# Patient Record
Sex: Female | Born: 1965 | Race: Black or African American | Hispanic: No | Marital: Single | Smoking: Never smoker
Health system: Southern US, Community
[De-identification: ages and names within clinical notes are randomized; demographics above are authoritative.]

## PROBLEM LIST (undated history)

## (undated) DIAGNOSIS — E785 Hyperlipidemia, unspecified: Secondary | ICD-10-CM

## (undated) DIAGNOSIS — E119 Type 2 diabetes mellitus without complications: Secondary | ICD-10-CM

## (undated) DIAGNOSIS — Z87442 Personal history of urinary calculi: Secondary | ICD-10-CM

## (undated) DIAGNOSIS — M1990 Unspecified osteoarthritis, unspecified site: Secondary | ICD-10-CM

## (undated) DIAGNOSIS — F419 Anxiety disorder, unspecified: Secondary | ICD-10-CM

## (undated) DIAGNOSIS — K219 Gastro-esophageal reflux disease without esophagitis: Secondary | ICD-10-CM

## (undated) DIAGNOSIS — R519 Headache, unspecified: Secondary | ICD-10-CM

## (undated) HISTORY — PX: CATARACT EXTRACTION W/ INTRAOCULAR LENS IMPLANT: SHX1309

## (undated) HISTORY — PX: KIDNEY STONE SURGERY: SHX686

## (undated) HISTORY — PX: COLONOSCOPY: SHX174

---

## 2003-03-05 ENCOUNTER — Ambulatory Visit (HOSPITAL_COMMUNITY): Admission: RE | Admit: 2003-03-05 | Discharge: 2003-03-05 | Payer: Self-pay | Admitting: *Deleted

## 2003-04-19 ENCOUNTER — Inpatient Hospital Stay (HOSPITAL_COMMUNITY): Admission: AD | Admit: 2003-04-19 | Discharge: 2003-04-19 | Payer: Self-pay | Admitting: *Deleted

## 2003-05-07 ENCOUNTER — Encounter: Admission: RE | Admit: 2003-05-07 | Discharge: 2003-05-07 | Payer: Self-pay | Admitting: *Deleted

## 2003-05-13 ENCOUNTER — Encounter: Admission: RE | Admit: 2003-05-13 | Discharge: 2003-08-11 | Payer: Self-pay | Admitting: *Deleted

## 2003-05-14 ENCOUNTER — Encounter: Admission: RE | Admit: 2003-05-14 | Discharge: 2003-05-14 | Payer: Self-pay | Admitting: *Deleted

## 2003-05-23 ENCOUNTER — Inpatient Hospital Stay (HOSPITAL_COMMUNITY): Admission: AD | Admit: 2003-05-23 | Discharge: 2003-05-23 | Payer: Self-pay | Admitting: Obstetrics & Gynecology

## 2003-05-28 ENCOUNTER — Encounter: Admission: RE | Admit: 2003-05-28 | Discharge: 2003-05-28 | Payer: Self-pay | Admitting: *Deleted

## 2003-05-28 ENCOUNTER — Ambulatory Visit (HOSPITAL_COMMUNITY): Admission: RE | Admit: 2003-05-28 | Discharge: 2003-05-28 | Payer: Self-pay | Admitting: *Deleted

## 2003-06-11 ENCOUNTER — Encounter: Admission: RE | Admit: 2003-06-11 | Discharge: 2003-06-11 | Payer: Self-pay | Admitting: *Deleted

## 2003-06-18 ENCOUNTER — Encounter: Admission: RE | Admit: 2003-06-18 | Discharge: 2003-06-18 | Payer: Self-pay | Admitting: *Deleted

## 2003-06-25 ENCOUNTER — Encounter: Admission: RE | Admit: 2003-06-25 | Discharge: 2003-06-25 | Payer: Self-pay | Admitting: *Deleted

## 2003-07-02 ENCOUNTER — Ambulatory Visit (HOSPITAL_COMMUNITY): Admission: RE | Admit: 2003-07-02 | Discharge: 2003-07-02 | Payer: Self-pay | Admitting: *Deleted

## 2003-07-02 ENCOUNTER — Encounter: Admission: RE | Admit: 2003-07-02 | Discharge: 2003-07-02 | Payer: Self-pay | Admitting: *Deleted

## 2003-07-04 ENCOUNTER — Encounter: Admission: RE | Admit: 2003-07-04 | Discharge: 2003-07-04 | Payer: Self-pay | Admitting: *Deleted

## 2003-07-09 ENCOUNTER — Encounter: Admission: RE | Admit: 2003-07-09 | Discharge: 2003-07-09 | Payer: Self-pay | Admitting: *Deleted

## 2003-07-11 ENCOUNTER — Encounter: Admission: RE | Admit: 2003-07-11 | Discharge: 2003-07-11 | Payer: Self-pay | Admitting: *Deleted

## 2003-07-11 ENCOUNTER — Inpatient Hospital Stay (HOSPITAL_COMMUNITY): Admission: AD | Admit: 2003-07-11 | Discharge: 2003-07-14 | Payer: Self-pay | Admitting: Obstetrics and Gynecology

## 2003-07-12 ENCOUNTER — Encounter (INDEPENDENT_AMBULATORY_CARE_PROVIDER_SITE_OTHER): Payer: Self-pay | Admitting: Specialist

## 2003-07-18 ENCOUNTER — Encounter: Admission: RE | Admit: 2003-07-18 | Discharge: 2003-07-18 | Payer: Self-pay | Admitting: Family Medicine

## 2009-12-28 ENCOUNTER — Encounter: Admission: RE | Admit: 2009-12-28 | Discharge: 2009-12-28 | Payer: Self-pay | Admitting: Internal Medicine

## 2010-01-29 ENCOUNTER — Encounter: Admission: RE | Admit: 2010-01-29 | Discharge: 2010-01-29 | Payer: Self-pay | Admitting: Internal Medicine

## 2010-04-16 ENCOUNTER — Emergency Department (HOSPITAL_COMMUNITY): Admission: EM | Admit: 2010-04-16 | Discharge: 2010-04-16 | Payer: Self-pay | Admitting: Emergency Medicine

## 2010-04-22 ENCOUNTER — Emergency Department (HOSPITAL_COMMUNITY): Admission: EM | Admit: 2010-04-22 | Discharge: 2010-04-22 | Payer: Self-pay | Admitting: Emergency Medicine

## 2010-12-08 ENCOUNTER — Emergency Department (HOSPITAL_COMMUNITY)
Admission: EM | Admit: 2010-12-08 | Discharge: 2010-12-08 | Payer: Self-pay | Source: Home / Self Care | Admitting: Emergency Medicine

## 2010-12-08 LAB — RAPID STREP SCREEN (MED CTR MEBANE ONLY): Streptococcus, Group A Screen (Direct): NEGATIVE

## 2011-02-22 LAB — URINALYSIS, ROUTINE W REFLEX MICROSCOPIC
Bilirubin Urine: NEGATIVE
Glucose, UA: NEGATIVE mg/dL
Ketones, ur: NEGATIVE mg/dL
Nitrite: NEGATIVE
Protein, ur: NEGATIVE mg/dL
Specific Gravity, Urine: 1.012 (ref 1.005–1.030)
Urobilinogen, UA: 1 mg/dL (ref 0.0–1.0)
pH: 6.5 (ref 5.0–8.0)

## 2011-02-22 LAB — URINE MICROSCOPIC-ADD ON

## 2011-02-22 LAB — POCT I-STAT, CHEM 8
BUN: 14 mg/dL (ref 6–23)
Calcium, Ion: 1.12 mmol/L (ref 1.12–1.32)
Chloride: 106 mEq/L (ref 96–112)
Creatinine, Ser: 0.7 mg/dL (ref 0.4–1.2)
Glucose, Bld: 88 mg/dL (ref 70–99)
HCT: 45 % (ref 36.0–46.0)
Hemoglobin: 15.3 g/dL — ABNORMAL HIGH (ref 12.0–15.0)
Potassium: 3.9 mEq/L (ref 3.5–5.1)
Sodium: 141 mEq/L (ref 135–145)
TCO2: 26 mmol/L (ref 0–100)

## 2011-02-22 LAB — POCT PREGNANCY, URINE: Preg Test, Ur: NEGATIVE

## 2011-04-22 NOTE — Discharge Summary (Signed)
   NAME:  Latoya Quinn, Latoya Quinn                          ACCOUNT NO.:  [id number]   MEDICAL RECORD NO.:  [id number]                   PATIENT TYPE:  INP   LOCATION:  9106                                 FACILITY:  [facility]   PHYSICIAN:  Phil D. Okey Dupre, M.D.                  DATE OF BIRTH:  [birth year]   DATE OF ADMISSION:  07/11/2003  DATE OF DISCHARGE:  07/14/2003                                 DISCHARGE SUMMARY   PRIMARY CARE PHYSICIAN:  [facility].   FINAL DIAGNOSES:  1. The patient is a 45 year old G3, P0-1-1-1 now G3, P1-1-1-2 who presented     at 38-4/7 weeks who went on to have a normal spontaneous vaginal delivery     of a live baby girl.  2. Bilateral tubal ligation performed postpartum day #1.   PRINCIPAL PROCEDURES:  1. Normal spontaneous vaginal delivery.  2. Bilateral tubal ligation performed by Dr. Shawnie Pons.   ADMISSION HISTORY AND PHYSICAL:  Please see chart.   LABORATORY DATA:  Discharge H&H please see chart.   HOSPITAL COURSE:  Ms. [name], a 45 year old G3, P0-1-1-1, presented at  38-4/7 weeks in labor.  She continued on to have a normal spontaneous  vaginal delivery and is now G3, P1-1-1-2.  Ms. [name] delivery was  unremarkable except for a knot in the placenta cord.  The placenta was  otherwise normal and postpartum day #1 Ms. [name] had a bilateral tubal  ligation performed which went well with no complications.  Ms. [name]  postpartum hospital course was unremarkable.  She desires to breast-feed the  baby with supplemental bottle feeds.  The baby was a viable girl.  The baby  had Apgar scores of 8 and 9 and there were no lacerations at the time of  delivery.  At the time of discharge the patient was in good condition.   INSTRUCTION TO PATIENT AND FAMILY:  The patient was instructed on care for  her incisions from the tubal ligation, she will remain on a normal diet and  is to follow up at [facility] in 6 weeks.   DISCHARGE MEDICATIONS:  1.  Percocet 5/325 one tablet p.o. q.4h. as needed for pain.  2. Ibuprofen 600 mg one tablet q.6h. as needed for pain.  3. Prenatal vitamins to be continued while breast-feeding.  4. Ferrous sulfate 325 mg one tab p.o. once daily.    ADVANCED DIRECTIVES:  None.   DO NOT RESUSCITATE STATUS:  Full Code.     Penni Bombard, MD                          Phil D. Okey Dupre, M.D.    SJ/MEDQ  D:  07/13/2003  T:  07/13/2003  Job:  [id number]   cc:   [facility]

## 2011-04-22 NOTE — Op Note (Signed)
NAME:  Latoya Quinn, Latoya Quinn                          ACCOUNT NO.:  [id number]   MEDICAL RECORD NO.:  [id number]                   PATIENT TYPE:  INP   LOCATION:  9106                                 [facility]:  [facility]   PHYSICIAN:  Tanya S. Shawnie Pons, M.D.                DATE OF BIRTH:  17-Mar-1966   DATE OF PROCEDURE:  07/13/2003  DATE OF DISCHARGE:                                 OPERATIVE REPORT   PREOPERATIVE DIAGNOSIS:  Multiparity and undesired fertility.   POSTOPERATIVE DIAGNOSIS:  Multiparity and undesired fertility.   PROCEDURE:  A postpartum bilateral tubal ligation with Filshie clips.   SURGEON:  Shelbie Proctor. Shawnie Pons, M.D.   ANESTHESIA:  Epidural, Germaine Pomfret, M.D.   FINDINGS:  Numerous omental adhesions to the anterior abdominal wall. Normal  appearing left ovary.   ESTIMATED BLOOD LOSS:  Less than 25 cc.   COMPLICATIONS:  None.   SPECIMENS:  None.   INDICATIONS FOR PROCEDURE:  The patient is a 45 year old gravida 2, para 1,  1-1-0-2 who is postpartum day #1 after  a spontaneous vaginal delivery who  desires permanent sterility. All risks and benefits of the procedure were  discussed with  prior to proceeding including  permanency of this procedure,  the risks of failure at 1 in 200 and increased risk of ectopic should  failure occur. The patient verbalized understanding  of these risks and  agreed to proceed with the procedure.   DESCRIPTION OF PROCEDURE:  The patient was taken to the operating room where  her epidural was dosed. She was then placed in the supine position on the  operating table. When anesthesia was found to be adequate she was prepped  and draped in the usual sterile fashion. Then 0.25% Marcaine was used to  inject the infraumbilical area, approximately 5 mL.   The 2 edges of  the incision were then grasped with Allis clamps and the  knife was used to make an incision infraumbilically of about 2 cm. This  incision was carried down to the underlying  fascia and the peritoneal cavity  was entered sharply. Upon admission to the peritoneal cavity, only the  omentum was visible as well as a couple of loops of large bowel. The uterus  could not be seen.   The patient was placed in deep Trendelenburg and still the uterus could not  be seen. A sponge stick was used to try to relocate the omentum  which could  not be  moved out of the way. Upon feeling down the anterior abdominal wall,  it was felt that the omentum  was strongly adherent to the anterior  abdominal wall. There was, however, an area where the surgeons fingers could  be placed down to the underlying uterus and the adnexa felt.   A Tanja Port was then followed  down the surgeon's fingers to grasp something  in the adnexa. In fact  the ovary was brought through the incision but the  tube was right-sided. It was quickly grasped with the Babcock clamp. The  fimbriated edge was visible at the time of 1st Babcock placement. This was  followed  back to approximately 2 cm from the uterus and a Filshie clip  placed about this tube.   On the patient's right side just by moving the omentum  over to the left,  the adnexa was visible and the tube grasped with  the Babcock clamp and  followed  to its fimbriated end. Approximately 2 cm from the cornu a Filshie  clip was placed about this segment of tube. The 1st Filshie clip did not  make it all the way around  1 side of the tube, so a 2nd one was placed  beside that and felt to be in the correct position.   The tube was allowed to return to the abdominal cavity. The fascia was then  closed with #1 Vicryl suture in a running fashion and the skin was closed  with 4-0 Vicryl in a subcuticular fashion. All instrument, needle  and lap  counts were correct x2. The patient was awakened and taken to the recovery  room in stable condition.                                               Shelbie Proctor. Shawnie Pons, M.D.    TSP/MEDQ  D:  07/13/2003  T:   07/13/2003  Job:  [id number]

## 2012-07-03 ENCOUNTER — Encounter (HOSPITAL_COMMUNITY): Payer: Self-pay | Admitting: Emergency Medicine

## 2012-07-03 ENCOUNTER — Emergency Department (HOSPITAL_COMMUNITY)
Admission: EM | Admit: 2012-07-03 | Discharge: 2012-07-03 | Disposition: A | Discharge status: Home Health | Payer: BC Managed Care – PPO | Attending: Emergency Medicine | Admitting: Emergency Medicine

## 2012-07-03 DIAGNOSIS — L259 Unspecified contact dermatitis, unspecified cause: Secondary | ICD-10-CM | POA: Insufficient documentation

## 2012-07-03 MED ORDER — FAMOTIDINE 20 MG PO TABS
20.0000 mg | ORAL_TABLET | Freq: Once | ORAL | Status: AC
  Administered 2012-07-03: 20 mg via ORAL
  Filled 2012-07-03: qty 1

## 2012-07-03 MED ORDER — PREDNISONE 20 MG PO TABS: 40.0000 mg | ORAL_TABLET | Freq: Every day | ORAL | Status: AC

## 2012-07-03 MED ORDER — PREDNISONE 20 MG PO TABS
60.0000 mg | ORAL_TABLET | Freq: Once | ORAL | Status: AC
  Administered 2012-07-03: 60 mg via ORAL
  Filled 2012-07-03: qty 3

## 2012-07-03 MED ORDER — FAMOTIDINE 20 MG PO TABS: 20.0000 mg | ORAL_TABLET | Freq: Two times a day (BID) | ORAL | Status: DC

## 2012-07-03 NOTE — ED Provider Notes (Signed)
History     CSN: [id number]  Arrival date & time 07/03/12  1956   First MD Initiated Contact with Patient 07/03/12 2111      Chief Complaint  Patient presents with  . Rash    (Consider location/radiation/quality/duration/timing/severity/associated sxs/prior treatment) HPI Comments: After going to the public pool.  She noticed some itchy lesions on her right forearm.  This has progressively gotten worse over the past week, to include both forearms.  Neck both anterior and posterior and circumflex oral just at the edge of the vermilion border.  Does not intrude into oral musoca  Patient is a 46 y.o. female presenting with rash. The history is provided by the patient.  Rash  This is a new problem. The current episode started more than 2 days ago. The problem has been gradually worsening. The problem is associated with an unknown factor. There has been no fever. The rash is present on the neck, right arm and left arm. The pain is at a severity of 0/10. The patient is experiencing no pain. Associated symptoms include itching. Pertinent negatives include no pain and no weeping. She has tried anti-itch cream for the symptoms. The treatment provided no relief. Risk factors include new environmental exposures.    History reviewed. No pertinent past medical history.  History reviewed. No pertinent past surgical history.  No family history on file.  History  Substance Use Topics  . Smoking status: Never Smoker   . Smokeless tobacco: Not on file  . Alcohol Use: No    OB History    Grav Para Term Preterm Abortions TAB SAB Ect Mult Living                  Review of Systems  Constitutional: Negative for fever.  Respiratory: Negative for shortness of breath.   Musculoskeletal: Negative for joint swelling.  Skin: Positive for itching and rash.  Neurological: Negative for dizziness.    Allergies  Penicillins and Sulfa antibiotics  Home Medications   Current Outpatient Rx  Name  Route Sig Dispense Refill  . FAMOTIDINE 20 MG PO TABS Oral Take 1 tablet (20 mg total) by mouth 2 (two) times daily. 20 tablet 0  . PREDNISONE 20 MG PO TABS Oral Take 2 tablets (40 mg total) by mouth daily. 10 tablet 0    BP 148/97  Pulse 68  Temp 98.3 F (36.8 C) (Oral)  Resp 18  SpO2 99%  Physical Exam  Constitutional: She appears well-developed and well-nourished.  HENT:  Head: Normocephalic.       Rash along the vermilion border, upper and lower does not appear to be herpetic  Eyes: Pupils are equal, round, and reactive to light.  Neck: Normal range of motion.  Cardiovascular: Normal rate.   Musculoskeletal: Normal range of motion. She exhibits no tenderness.  Neurological: She is alert.  Skin: Skin is warm. Rash noted. No erythema.    ED Course  Procedures (including critical care time)  Labs Reviewed - No data to display No results found.   1. Contact dermatitis       MDM   The contact dermatitis.  That is getting worse despite the use of over-the-counter cortisone cream and by mouth Zyrtec.  Will change to a steroid taper, as well as Pepcid        Arman Filter, NP 07/03/12 2128  Arman Filter, NP 07/03/12 2128

## 2012-07-03 NOTE — ED Notes (Signed)
PT. REPORTS ITCHY RASHES AT ARMS , NECK AND LIPS ONSET LAST WEEK WORSE TODAY UNRELIEVED BY OTC CORTIZONE CREAM , RESPIRATIONS UNLABORED.

## 2012-07-04 NOTE — ED Provider Notes (Signed)
Medical screening examination/treatment/procedure(s) were performed by non-physician practitioner and as supervising physician I was immediately available for consultation/collaboration.  Derwood Kaplan, MD 07/04/12 [phone]

## 2016-06-30 ENCOUNTER — Ambulatory Visit: Admit: 2016-06-30 | Payer: BC Managed Care – PPO | Admitting: Urology

## 2016-06-30 SURGERY — LITHOTRIPSY, ESWL
Anesthesia: LOCAL | Laterality: Left

## 2018-06-27 ENCOUNTER — Emergency Department (HOSPITAL_COMMUNITY)
Admission: EM | Admit: 2018-06-27 | Discharge: 2018-06-27 | Disposition: A | Discharge status: Home / Self Care | Payer: BC Managed Care – PPO | Attending: Emergency Medicine | Admitting: Emergency Medicine

## 2018-06-27 ENCOUNTER — Other Ambulatory Visit: Payer: Self-pay

## 2018-06-27 ENCOUNTER — Emergency Department (HOSPITAL_COMMUNITY): Payer: BC Managed Care – PPO

## 2018-06-27 ENCOUNTER — Encounter (HOSPITAL_COMMUNITY): Payer: Self-pay

## 2018-06-27 DIAGNOSIS — Y9301 Activity, walking, marching and hiking: Secondary | ICD-10-CM | POA: Insufficient documentation

## 2018-06-27 DIAGNOSIS — W101XXA Fall (on)(from) sidewalk curb, initial encounter: Secondary | ICD-10-CM | POA: Insufficient documentation

## 2018-06-27 DIAGNOSIS — Z79899 Other long term (current) drug therapy: Secondary | ICD-10-CM | POA: Diagnosis not present

## 2018-06-27 DIAGNOSIS — S93401A Sprain of unspecified ligament of right ankle, initial encounter: Secondary | ICD-10-CM | POA: Diagnosis not present

## 2018-06-27 DIAGNOSIS — S99911A Unspecified injury of right ankle, initial encounter: Secondary | ICD-10-CM | POA: Diagnosis present

## 2018-06-27 DIAGNOSIS — Y929 Unspecified place or not applicable: Secondary | ICD-10-CM | POA: Insufficient documentation

## 2018-06-27 DIAGNOSIS — Y999 Unspecified external cause status: Secondary | ICD-10-CM | POA: Insufficient documentation

## 2018-06-27 MED ORDER — IBUPROFEN 800 MG PO TABS: 800.0000 mg | ORAL_TABLET | Freq: Three times a day (TID) | ORAL | 0 refills | Status: DC | PRN

## 2018-06-27 MED ORDER — TRAMADOL HCL 50 MG PO TABS: 50.0000 mg | ORAL_TABLET | Freq: Four times a day (QID) | ORAL | 0 refills | Status: DC | PRN

## 2018-06-27 NOTE — ED Triage Notes (Signed)
EMS reports Pt walking into work, fell from curb rolled ankle, c/o pain and swelling to right ankle. Denies dizziness, LOC or other pain.  BP 160/100 HR 79 Resp 18 Sp02 100 RA

## 2018-06-27 NOTE — Discharge Instructions (Signed)
Follow-up with your family doctor next week or you can follow-up with the orthopedic surgeon Dr. Everardo PacificVarkey.  Keep your leg elevated and use your crutches for the next week to help with ambulation

## 2018-06-27 NOTE — ED Provider Notes (Signed)
[facility] Note   CSN: [id number] Arrival date & time: 06/27/18  0813     History   Chief Complaint Chief Complaint  Patient presents with  . Fall  . Ankle Pain  . Joint Swelling    HPI Ellin GoodieLenora S Olson is a 52 y.o. female.  Patient turned her right ankle today on a curve.  She complains of lateral right ankle pain  The history is provided by the patient. No language interpreter was used.  Fall  This is a new problem. The current episode started 1 to 2 hours ago. The problem occurs rarely. The problem has been resolved. Pertinent negatives include no chest pain, no abdominal pain and no headaches. Exacerbated by: Palpation and ambulation. Nothing relieves the symptoms. She has tried nothing for the symptoms. The treatment provided no relief.    History reviewed. No pertinent past medical history.  There are no active problems to display for this patient.   History reviewed. No pertinent surgical history.   OB History   None      Home Medications    Prior to Admission medications   Medication Sig Start Date End Date Taking? Authorizing Provider  hydroxychloroquine (PLAQUENIL) 200 MG tablet Take 400 mg by mouth daily. 06/04/18  Yes [provider]  PARoxetine (PAXIL) 10 MG tablet Take 10 mg by mouth every morning. 05/23/18  Yes [provider]  famotidine (PEPCID) 20 MG tablet Take 1 tablet (20 mg total) by mouth 2 (two) times daily. 07/03/12 07/03/13  Earley FavorSchulz, Gail, NP  ibuprofen (ADVIL,MOTRIN) 800 MG tablet Take 1 tablet (800 mg total) by mouth every 8 (eight) hours as needed for moderate pain. 06/27/18   Bethann BerkshireZammit, Jujhar Everett, MD  traMADol (ULTRAM) 50 MG tablet Take 1 tablet (50 mg total) by mouth every 6 (six) hours as needed. 06/27/18   Bethann BerkshireZammit, Ricardo Kayes, MD    Family History History reviewed. No pertinent family history.  Social History Social History   Tobacco Use  . Smoking status: Never Smoker  . Smokeless  tobacco: Never Used  Substance Use Topics  . Alcohol use: No  . Drug use: No     Allergies   Penicillins and Sulfa antibiotics   Review of Systems Review of Systems  Constitutional: Negative for appetite change and fatigue.  HENT: Negative for congestion, ear discharge and sinus pressure.   Eyes: Negative for discharge.  Respiratory: Negative for cough.   Cardiovascular: Negative for chest pain.  Gastrointestinal: Negative for abdominal pain and diarrhea.  Genitourinary: Negative for frequency and hematuria.  Musculoskeletal: Negative for back pain.       Swelling lateral right ankle neurovascular exam intact stable ankle.  Also tenderness  Skin: Negative for rash.  Neurological: Negative for seizures and headaches.  Psychiatric/Behavioral: Negative for hallucinations.     Physical Exam Updated Vital Signs BP (!) 145/89   Pulse 65   Temp 97.8 F (36.6 C) (Oral)   Resp 16   Ht 5\' 6"  (1.676 m)   Wt 82.1 kg (181 lb)   SpO2 100%   BMI 29.21 kg/m   Physical Exam  Constitutional: She is oriented to person, place, and time. She appears well-developed.  HENT:  Head: Normocephalic.  Eyes: Conjunctivae are normal.  Neck: No tracheal deviation present.  Cardiovascular:  No murmur heard. Musculoskeletal: Normal range of motion.  Tender swollen right lateral ankle neurovascularly normal  Neurological: She is oriented to person, place, and time.  Skin: Skin is warm.  Psychiatric: She has a normal mood and affect.     ED Treatments / Results  Labs (all labs ordered are listed, but only abnormal results are displayed) Labs Reviewed - No data to display  EKG None  Radiology Dg Ankle Complete Right  Result Date: 06/27/2018 CLINICAL DATA:  Fall with right ankle pain. EXAM: RIGHT ANKLE - COMPLETE 3+ VIEW COMPARISON:  None. FINDINGS: Right ankle is located. There is soft tissue swelling particularly along the lateral aspect. There are multiple densities just distal to  the lateral malleolus and findings raise concern for an acute avulsion injury in this area. However, this avulsion injury is age indeterminate due to the multiple loose bodies in this area. Multiple loose bodies just distal to the medial malleolus. Prominent spur along the plantar aspect of the calcaneus. IMPRESSION: Soft tissue swelling in the right ankle, particularly along the lateral malleolus. Evidence for an avulsion injury along the lateral malleolus which is likely acute. Chronic changes along the medial malleolus. Calcaneal spur. Electronically Signed   By: Richarda Overlie M.D.   On: 06/27/2018 09:03    Procedures Procedures (including critical care time)  Medications Ordered in ED Medications - No data to display   Initial Impression / Assessment and Plan / ED Course  I have reviewed the triage vital signs and the nursing notes.  Pertinent labs & imaging results that were available during my care of the patient were reviewed by me and considered in my medical decision making (see chart for details). Sprain right ankle with possible mild avulsion fracture.  She is given a cam walker and crutches and Motrin and Ultram and will follow up with either her family doctor or orthopedics     Final Clinical Impressions(s) / ED Diagnoses   Final diagnoses:  Sprain of right ankle, unspecified ligament, initial encounter    ED Discharge Orders        Ordered    ibuprofen (ADVIL,MOTRIN) 800 MG tablet  Every 8 hours PRN     06/27/18 1001    traMADol (ULTRAM) 50 MG tablet  Every 6 hours PRN     06/27/18 1001       Bethann Berkshire, MD 06/27/18 1006

## 2018-06-27 NOTE — ED Notes (Signed)
Bed: WA10 Expected date: 06/27/18 Expected time: 8:12 AM Means of arrival: Ambulance Comments: EMS ankle pain

## 2020-01-24 ENCOUNTER — Ambulatory Visit: Payer: BC Managed Care – PPO | Attending: Internal Medicine

## 2020-01-24 DIAGNOSIS — Z23 Encounter for immunization: Secondary | ICD-10-CM | POA: Insufficient documentation

## 2020-01-24 NOTE — Progress Notes (Signed)
   Covid-19 Vaccination Clinic  Name:  RIELY OETKEN    MRN: [id number] DOB: 1966-10-12  01/24/2020  Ms. Doepke was observed post Covid-19 immunization for 15 minutes without incidence. She was provided with Vaccine Information Sheet and instruction to access the V-Safe system.   Ms. Battiste was instructed to call 911 with any severe reactions post vaccine: Marland Kitchen Difficulty breathing  . Swelling of your face and throat  . A fast heartbeat  . A bad rash all over your body  . Dizziness and weakness    Immunizations Administered    Name Date Dose VIS Date Route   Pfizer COVID-19 Vaccine 01/24/2020  3:52 PM 0.3 mL 11/15/2019 Intramuscular   Manufacturer: [employer], [employer]   Lot: [id number]   NDC: [address]-6

## 2020-02-18 ENCOUNTER — Ambulatory Visit: Payer: BC Managed Care – PPO | Attending: Internal Medicine

## 2020-02-18 DIAGNOSIS — Z23 Encounter for immunization: Secondary | ICD-10-CM

## 2020-02-18 NOTE — Progress Notes (Signed)
   Covid-19 Vaccination Clinic  Name:  Latoya Quinn    MRN: [id number] DOB: 1966-09-13  02/18/2020  Latoya Quinn was observed post Covid-19 immunization for 30 minutes based on pre-vaccination screening without incident. She was provided with Vaccine Information Sheet and instruction to access the V-Safe system.   Latoya Quinn was instructed to call 911 with any severe reactions post vaccine: Marland Kitchen Difficulty breathing  . Swelling of face and throat  . A fast heartbeat  . A bad rash all over body  . Dizziness and weakness   Immunizations Administered    Name Date Dose VIS Date Route   Pfizer COVID-19 Vaccine 02/18/2020  4:38 PM 0.3 mL 11/15/2019 Intramuscular   Manufacturer: [employer], [employer]   Lot: [id number]   NDC: [address]-6

## 2021-04-15 ENCOUNTER — Telehealth: Payer: BC Managed Care – PPO | Admitting: Physician Assistant

## 2021-04-15 DIAGNOSIS — R300 Dysuria: Secondary | ICD-10-CM

## 2021-04-15 MED ORDER — NITROFURANTOIN MONOHYD MACRO 100 MG PO CAPS: 100.0000 mg | ORAL_CAPSULE | Freq: Two times a day (BID) | ORAL | 0 refills | Status: DC

## 2021-04-15 NOTE — Progress Notes (Signed)
I have spent 5 minutes in review of e-visit questionnaire, review and updating patient chart, medical decision making and response to patient.   Fayette Hamada Cody Donita Newland, PA-C    

## 2021-04-15 NOTE — Progress Notes (Signed)

## 2021-11-04 ENCOUNTER — Emergency Department (HOSPITAL_COMMUNITY)
Admission: EM | Admit: 2021-11-04 | Discharge: 2021-11-05 | Disposition: A | Discharge status: Home / Self Care | Payer: BC Managed Care – PPO | Attending: Emergency Medicine | Admitting: Emergency Medicine

## 2021-11-04 ENCOUNTER — Other Ambulatory Visit: Payer: Self-pay

## 2021-11-04 ENCOUNTER — Emergency Department (HOSPITAL_COMMUNITY): Payer: BC Managed Care – PPO

## 2021-11-04 DIAGNOSIS — J101 Influenza due to other identified influenza virus with other respiratory manifestations: Secondary | ICD-10-CM | POA: Insufficient documentation

## 2021-11-04 DIAGNOSIS — H938X3 Other specified disorders of ear, bilateral: Secondary | ICD-10-CM | POA: Diagnosis not present

## 2021-11-04 DIAGNOSIS — R519 Headache, unspecified: Secondary | ICD-10-CM | POA: Diagnosis present

## 2021-11-04 DIAGNOSIS — Z20822 Contact with and (suspected) exposure to covid-19: Secondary | ICD-10-CM | POA: Diagnosis not present

## 2021-11-04 LAB — COMPREHENSIVE METABOLIC PANEL
ALT: 54 U/L — ABNORMAL HIGH (ref 0–44)
AST: 57 U/L — ABNORMAL HIGH (ref 15–41)
Albumin: 3.6 g/dL (ref 3.5–5.0)
Alkaline Phosphatase: 79 U/L (ref 38–126)
Anion gap: 8 (ref 5–15)
BUN: 8 mg/dL (ref 6–20)
CO2: 26 mmol/L (ref 22–32)
Calcium: 8.7 mg/dL — ABNORMAL LOW (ref 8.9–10.3)
Chloride: 101 mmol/L (ref 98–111)
Creatinine, Ser: 0.94 mg/dL (ref 0.44–1.00)
GFR, Estimated: >60 mL/min (ref >60)
Glucose, Bld: 128 mg/dL — ABNORMAL HIGH (ref 70–99)
Potassium: 4.4 mmol/L (ref 3.5–5.1)
Sodium: 135 mmol/L (ref 135–145)
Total Bilirubin: 1 mg/dL (ref 0.3–1.2)
Total Protein: 7.6 g/dL (ref 6.5–8.1)

## 2021-11-04 LAB — CBC WITH DIFFERENTIAL/PLATELET
Abs Immature Granulocytes: 0.01 10*3/uL (ref 0.00–0.07)
Basophils Absolute: 0 10*3/uL (ref 0.0–0.1)
Basophils Relative: 1 %
Eosinophils Absolute: 0 10*3/uL (ref 0.0–0.5)
Eosinophils Relative: 0 %
HCT: 47.1 % — ABNORMAL HIGH (ref 36.0–46.0)
Hemoglobin: 15.9 g/dL — ABNORMAL HIGH (ref 12.0–15.0)
Immature Granulocytes: 0 %
Lymphocytes Relative: 27 %
Lymphs Abs: 1.2 10*3/uL (ref 0.7–4.0)
MCH: 29.9 pg (ref 26.0–34.0)
MCHC: 33.8 g/dL (ref 30.0–36.0)
MCV: 88.5 fL (ref 80.0–100.0)
Monocytes Absolute: 0.5 10*3/uL (ref 0.1–1.0)
Monocytes Relative: 11 %
Neutro Abs: 2.6 10*3/uL (ref 1.7–7.7)
Neutrophils Relative %: 61 %
Platelets: 198 10*3/uL (ref 150–400)
RBC: 5.32 MIL/uL — ABNORMAL HIGH (ref 3.87–5.11)
RDW: 13.1 % (ref 11.5–15.5)
WBC: 4.3 10*3/uL (ref 4.0–10.5)
nRBC: 0 % (ref 0.0–0.2)

## 2021-11-04 LAB — RESP PANEL BY RT-PCR (FLU A&B, COVID) ARPGX2
Influenza A by PCR: POSITIVE — AB
Influenza B by PCR: NEGATIVE
SARS Coronavirus 2 by RT PCR: NEGATIVE

## 2021-11-04 MED ORDER — ACETAMINOPHEN 325 MG PO TABS
650.0000 mg | ORAL_TABLET | Freq: Once | ORAL | Status: AC
  Administered 2021-11-04: 650 mg via ORAL
  Filled 2021-11-04: qty 2

## 2021-11-04 NOTE — ED Provider Notes (Signed)
Emergency Medicine Provider Triage Evaluation Note  Latoya Quinn , a 55 y.o. female  was evaluated in triage.  Pt complains of headache, intermittent episodes of confusion.  These have been ongoing for the past 2 days.  Evaluated by PCP who requested a flu/COVID test. Friend at the bedside, reports she was "lethargic ", these last couple of days.  She has been confusing her times and dates.  Patient reports some chills, a subjective fever.  Describes the headache as nagging, despite Tylenol without any improvement.   Review of Systems  Positive: Confusion, chills, headache, generalized weakness Negative: Trauma, shortness of breath, chest pain  Physical Exam  BP (!) 132/95 (BP Location: Right Arm)   Pulse (!) 113   Temp 99.9 F (37.7 C) (Oral)   Resp 20   SpO2 97%  Gen:   Awake, no distress   Resp:  Normal effort  MSK:   Moves extremities without difficulty  Other:    Medical Decision Making  Medically screening exam initiated at 7:40 PM.  Appropriate orders placed.  Latoya Quinn was informed that the remainder of the evaluation will be completed by another provider, this initial triage assessment does not replace that evaluation, and the importance of remaining in the ED until their evaluation is complete.  Patient here for generalized weakness, nausea, vomiting, headache since Tuesday.  Per PCP who recommended CT head along with test for COVID and flu.  Patient's vitals are overall well-appearing, slight temperature at 99.9, given tylenol while in the triage.    Claude Manges, PA-C 11/04/21 1946    Mancel Bale, MD 11/04/21 [phone]

## 2021-11-04 NOTE — ED Triage Notes (Signed)
Pt states she was sent by her doctor for a flu/covid test and head CT. C/o nausea/vomiting and headache since Tuesday. Also c/o intermittent confusion. Pt A&Ox4 at this time.

## 2021-11-05 LAB — URINALYSIS, MICROSCOPIC (REFLEX)

## 2021-11-05 LAB — URINALYSIS, ROUTINE W REFLEX MICROSCOPIC
Bilirubin Urine: NEGATIVE
Glucose, UA: NEGATIVE mg/dL
Hgb urine dipstick: NEGATIVE
Ketones, ur: 15 mg/dL — AB
Nitrite: NEGATIVE
Protein, ur: NEGATIVE mg/dL
Specific Gravity, Urine: 1.025 (ref 1.005–1.030)
pH: 6 (ref 5.0–8.0)

## 2021-11-05 MED ORDER — ACETAMINOPHEN 325 MG PO TABS
650.0000 mg | ORAL_TABLET | Freq: Once | ORAL | Status: AC
  Administered 2021-11-05: 650 mg via ORAL
  Filled 2021-11-05: qty 2

## 2021-11-05 MED ORDER — SODIUM CHLORIDE 0.9 % IV BOLUS
1000.0000 mL | Freq: Once | INTRAVENOUS | Status: AC
  Administered 2021-11-05: 1000 mL via INTRAVENOUS

## 2021-11-05 MED ORDER — IBUPROFEN 400 MG PO TABS
600.0000 mg | ORAL_TABLET | Freq: Once | ORAL | Status: AC
  Administered 2021-11-05: 600 mg via ORAL
  Filled 2021-11-05: qty 1

## 2021-11-05 MED ORDER — ONDANSETRON HCL 4 MG/2ML IJ SOLN
4.0000 mg | Freq: Once | INTRAMUSCULAR | Status: AC
  Administered 2021-11-05: 4 mg via INTRAVENOUS
  Filled 2021-11-05: qty 2

## 2021-11-05 NOTE — ED Provider Notes (Signed)
[facility]PART[facility] Provider Note   CSN: [id number] Arrival date & time: 11/04/21  1725     History Chief Complaint  Patient presents with   Headache    Latoya Quinn is a 55 y.o. female.  55 year old female with history of hyperlipidemia and prediabetes presents with complaint of headache, body aches, fever, chills, cough and vomiting.  Patient reports her symptoms started on 11/02/2021.  No known sick contacts.  Vomiting has since resolved, not having any diarrhea or abdominal pain.  Reports that she has been feeling much lethargic for the past few days which prompted her to come to the emergency room.  Denies falls or injuries.  Denies history of asthma or other chronic lung disease. No other complaints or concerns.         OB History   No obstetric history on file.     No family history on file.  Social History   Tobacco Use   Smoking status: Never   Smokeless tobacco: Never  Substance Use Topics   Alcohol use: No   Drug use: No    Home Medications Prior to Admission medications   Medication Sig Start Date End Date Taking? Authorizing Provider  famotidine (PEPCID) 20 MG tablet Take 1 tablet (20 mg total) by mouth 2 (two) times daily. 07/03/12 07/03/13  Junius Creamer, NP  hydroxychloroquine (PLAQUENIL) 200 MG tablet Take 400 mg by mouth daily. 06/04/18   [provider]  ibuprofen (ADVIL,MOTRIN) 800 MG tablet Take 1 tablet (800 mg total) by mouth every 8 (eight) hours as needed for moderate pain. 06/27/18   Milton Ferguson, MD  nitrofurantoin, macrocrystal-monohydrate, (MACROBID) 100 MG capsule Take 1 capsule (100 mg total) by mouth 2 (two) times daily. 04/15/21   Brunetta Jeans, PA-C  PARoxetine (PAXIL) 10 MG tablet Take 10 mg by mouth every morning. 05/23/18   [provider]  traMADol (ULTRAM) 50 MG tablet Take 1 tablet (50 mg total) by mouth every 6 (six) hours as needed. 06/27/18   Milton Ferguson, MD    Allergies     Penicillins and Sulfa antibiotics  Review of Systems   Review of Systems  Constitutional:  Positive for chills and fever.  HENT:  Positive for congestion. Negative for sore throat.   Respiratory:  Positive for cough.   Gastrointestinal:  Positive for nausea and vomiting. Negative for constipation and diarrhea.  Genitourinary:  Negative for dysuria.  Musculoskeletal:  Positive for arthralgias and myalgias.  Skin:  Negative for rash and wound.  Allergic/Immunologic: Negative for immunocompromised state.  Neurological:  Positive for weakness and headaches.  Hematological:  Negative for adenopathy.  Psychiatric/Behavioral:  Negative for confusion.   All other systems reviewed and are negative.  Physical Exam Updated Vital Signs BP 123/81 (BP Location: Right Arm)   Pulse 95   Temp 98.9 F (37.2 C) (Oral)   Resp 18   Ht 5\' 6"  (1.676 m)   Wt 90.7 kg   SpO2 99%   BMI 32.28 kg/m   Physical Exam Vitals and nursing note reviewed.  Constitutional:      General: She is not in acute distress.    Appearance: She is well-developed. She is not diaphoretic.  HENT:     Head: Normocephalic and atraumatic.     Right Ear: Ear canal normal. A middle ear effusion is present. Tympanic membrane is not injected, erythematous or bulging.     Left Ear: Ear canal normal. A middle ear effusion is present.  Tympanic membrane is not injected, erythematous or bulging.     Nose: Nose normal.     Mouth/Throat:     Mouth: Mucous membranes are moist.  Eyes:     Conjunctiva/sclera: Conjunctivae normal.  Cardiovascular:     Rate and Rhythm: Normal rate and regular rhythm.     Heart sounds: Normal heart sounds.  Pulmonary:     Effort: Pulmonary effort is normal.     Breath sounds: Normal breath sounds.  Abdominal:     Palpations: Abdomen is soft.     Tenderness: There is no abdominal tenderness.  Musculoskeletal:     Cervical back: Neck supple.     Right lower leg: No edema.     Left lower leg: No  edema.  Lymphadenopathy:     Cervical: No cervical adenopathy.  Skin:    General: Skin is warm and dry.     Findings: No erythema or rash.  Neurological:     Mental Status: She is alert and oriented to person, place, and time.     GCS: GCS eye subscore is 4. GCS verbal subscore is 5. GCS motor subscore is 6.     Motor: No weakness.  Psychiatric:        Behavior: Behavior normal.    ED Results / Procedures / Treatments   Labs (all labs ordered are listed, but only abnormal results are displayed) Labs Reviewed  RESP PANEL BY RT-PCR (FLU A&B, COVID) ARPGX2 - Abnormal; Notable for the following components:      Result Value   Influenza A by PCR POSITIVE (*)    All other components within normal limits  CBC WITH DIFFERENTIAL/PLATELET - Abnormal; Notable for the following components:   RBC 5.32 (*)    Hemoglobin 15.9 (*)    HCT 47.1 (*)    All other components within normal limits  COMPREHENSIVE METABOLIC PANEL - Abnormal; Notable for the following components:   Glucose, Bld 128 (*)    Calcium 8.7 (*)    AST 57 (*)    ALT 54 (*)    All other components within normal limits  URINALYSIS, ROUTINE W REFLEX MICROSCOPIC - Abnormal; Notable for the following components:   Color, Urine AMBER (*)    Ketones, ur 15 (*)    Leukocytes,Ua TRACE (*)    All other components within normal limits  URINALYSIS, MICROSCOPIC (REFLEX) - Abnormal; Notable for the following components:   Bacteria, UA RARE (*)    Non Squamous Epithelial PRESENT (*)    All other components within normal limits    EKG None  Radiology CT HEAD WO CONTRAST ( )  Result Date: 11/04/2021 CLINICAL DATA:  Headache with confusion EXAM: CT HEAD WITHOUT CONTRAST TECHNIQUE: Contiguous axial images were obtained from the base of the skull through the vertex without intravenous contrast. COMPARISON:  None. FINDINGS: Brain: No evidence of acute infarction, hemorrhage, hydrocephalus, extra-axial collection or mass lesion/mass  effect. Vascular: No hyperdense vessel or unexpected calcification. Skull: Normal. Negative for fracture or focal lesion. Sinuses/Orbits: No acute finding. Other: None IMPRESSION: Negative non contrasted CT appearance of the brain. Electronically Signed   By: Jasmine Pang M.D.   On: 11/04/2021 20:55    Procedures Procedures   Medications Ordered in ED Medications  acetaminophen (TYLENOL) tablet 650 mg (650 mg Oral Given 11/04/21 1952)  sodium chloride 0.9 % bolus 1,000 mL (0 mLs Intravenous Stopped 11/05/21 1256)  ondansetron (ZOFRAN) injection 4 mg (4 mg Intravenous Given 11/05/21 1207)  ibuprofen (ADVIL) tablet  600 mg (600 mg Oral Given 11/05/21 1209)  acetaminophen (TYLENOL) tablet 650 mg (650 mg Oral Given 11/05/21 1209)    ED Course  I have reviewed the triage vital signs and the nursing notes.  Pertinent labs & imaging results that were available during my care of the patient were reviewed by me and considered in my medical decision making (see chart for details).  Clinical Course as of 11/05/21 1311  Fri Nov 06, 4183  6069 55 year old female presents with feeling generally unwell for the past 3 days, as above.  On exam, she is alert, oriented, speech is clear moves all extremities with purpose.  Lungs are clear to auscultation, abdomen is soft and nontender.  Appears to not be feeling well today.  Vitals are reviewed, she is afebrile, normotensive with an O2 sat 99% on room air. Labs reviewed, patient is found a positive for influenza A.  CBC with elevated red blood cell, hemoglobin, hematocrit, likely secondary to dehydration from her fever, vomiting, secondary to the flu.  CMP with mildly elevated LFTs at 57 and 54.  Normal creatinine.  Urinalysis with trace leukocytes, contaminated sample without urinary symptoms.  CT head ordered for concern for generalized weakness and confusion and is unremarkable. Patient was offered and accepts IV fluids for her dehydration.  Also given ibuprofen  and Tylenol. Somewhat improved although continues to feel generally unwell, suspect this is secondary to having the flu. Recommend, Home to rest, hydrating fluids, Motrin Tylenol and recheck with PCP as needed.  Return to ED for worsening or concerning symptoms. [LM]    Clinical Course User Index [LM] Roque Lias   MDM Rules/Calculators/A&P                           Final Clinical Impression(s) / ED Diagnoses Final diagnoses:  Influenza A    Rx / DC Orders ED Discharge Orders     None        Tacy Learn, PA-C 11/05/21 1311    Deno Etienne, DO 11/05/21 1320

## 2021-11-05 NOTE — Discharge Instructions (Signed)
Follow up with your primary care provider for recheck. Return to the ER for worsening or concerning symptoms. Home to rest, Motrin and Tylenol as needed as directed for headache, body aches. Hydrating fluids such as low sugar gatorade.

## 2022-01-04 ENCOUNTER — Encounter (HOSPITAL_COMMUNITY): Payer: Self-pay

## 2022-01-04 ENCOUNTER — Other Ambulatory Visit: Payer: Self-pay

## 2022-01-04 ENCOUNTER — Emergency Department (HOSPITAL_COMMUNITY): Payer: BC Managed Care – PPO

## 2022-01-04 ENCOUNTER — Emergency Department (HOSPITAL_COMMUNITY)
Admission: EM | Admit: 2022-01-04 | Discharge: 2022-01-04 | Disposition: A | Discharge status: Home / Self Care | Payer: BC Managed Care – PPO | Attending: Emergency Medicine | Admitting: Emergency Medicine

## 2022-01-04 DIAGNOSIS — M545 Low back pain, unspecified: Secondary | ICD-10-CM | POA: Diagnosis not present

## 2022-01-04 DIAGNOSIS — R519 Headache, unspecified: Secondary | ICD-10-CM | POA: Diagnosis present

## 2022-01-04 DIAGNOSIS — M542 Cervicalgia: Secondary | ICD-10-CM | POA: Diagnosis not present

## 2022-01-04 DIAGNOSIS — Y92410 Unspecified street and highway as the place of occurrence of the external cause: Secondary | ICD-10-CM | POA: Insufficient documentation

## 2022-01-04 MED ORDER — ACETAMINOPHEN 325 MG PO TABS
650.0000 mg | ORAL_TABLET | Freq: Once | ORAL | Status: AC
Start: 2022-01-04 — End: 2022-01-04
  Administered 2022-01-04: 650 mg via ORAL
  Filled 2022-01-04: qty 2

## 2022-01-04 NOTE — Discharge Instructions (Addendum)
You will be sore in the coming days.  Please take Tylenol or ibuprofen as needed for pain control.  Please follow-up primary care doctor.  Please return to the emergency department for worsening symptoms.
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
## 2022-01-04 NOTE — ED Notes (Signed)
Patient transported to X-ray 
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
## 2022-05-25 ENCOUNTER — Institutional Professional Consult (permissible substitution): Payer: BC Managed Care – PPO | Admitting: Student

## 2022-06-08 ENCOUNTER — Institutional Professional Consult (permissible substitution): Payer: BC Managed Care – PPO | Admitting: Internal Medicine

## 2022-07-04 ENCOUNTER — Encounter: Payer: Self-pay | Admitting: Pulmonary Disease

## 2022-07-04 ENCOUNTER — Ambulatory Visit (INDEPENDENT_AMBULATORY_CARE_PROVIDER_SITE_OTHER): Payer: BC Managed Care – PPO | Admitting: Pulmonary Disease

## 2022-07-04 VITALS — BP 118/80 | HR 70 | Temp 98.6°F | Ht 65.0 in | Wt 201.4 lb

## 2022-07-04 DIAGNOSIS — R0609 Other forms of dyspnea: Secondary | ICD-10-CM

## 2022-07-04 DIAGNOSIS — J849 Interstitial pulmonary disease, unspecified: Secondary | ICD-10-CM

## 2022-07-04 NOTE — Assessment & Plan Note (Signed)
Due to dyspnea on exertion and change in her voice, I am concerned about systemic effects of rheumatoid arthritis. We will repeat high-resolution CT chest to see if she has interstitial lung disease.  We will also check PFTs to see if she is developed restrictive pattern. It is reassuring to note that she does not desaturate on ambulation. She has follow-up with a rheumatologist and is maintained on Plaquenil

## 2022-07-04 NOTE — Patient Instructions (Signed)
X ambulatory saturation  X HRCT chest to rule out ILD  x schedule PFTs for shortness of breath

## 2022-07-04 NOTE — Progress Notes (Signed)
   Subjective:    Patient ID: Latoya Quinn, female    DOB: 30-Nov-1966, 56 y.o.   MRN: [id number]  HPI 56 year old never smoker referred for evaluation of dyspnea on exertion  PMH -diabetes type 2 Essential tremor -started on propranolol Undifferentiated connective tissue disease -has seen rheumatology,?  RA versus Sjogren's on Plaquenil  She reports shortness of breath for about 3 years ever since COVID first came out.  She feels like she is holding her breath when she is talking.  She can only walk for a few minutes before she gets short of breath.  She has to climb to the second floor and this puts her out of breath She had a pulmonary evaluation in May 2021 PFTs showed restriction , normal spirometry, reduced TLC, DLCO mildly reduced  HRCT chest 05/2020 no ILD  Her cough has improved since then. She was placed on albuterol MDI and Singulair and not sure whether this helps   On ambulation, she did not desaturate on exertion, heart rate increased from 74-1 02 and oxygen saturation dropped from 100 to 97%  I have reviewed previous pulmonology consultation No past medical history on file. Past surgical history -tubal ligation 2004  Social never smoker, she works as a Air traffic controller at [employer]. She lives by herself.  Family history positive for asthma in her mother and heart disease, rheumatism in her mother,  Review of Systems neg for any significant sore throat, dysphagia, itching, sneezing, nasal congestion or excess/ purulent secretions, fever, chills, sweats, unintended wt loss, pleuritic or exertional cp, hempoptysis, orthopnea pnd or change in chronic leg swelling. Also denies presyncope, palpitations, heartburn, abdominal pain, nausea, vomiting, diarrhea or change in bowel or urinary habits, dysuria,hematuria, rash, arthralgias, visual complaints, headache, numbness weakness or ataxia.     Objective:   Physical Exam  Gen. Pleasant, obese, in no distress, anxious  affect ENT - no pallor,icterus, no post nasal drip, class 2 airway Neck: No JVD, no thyromegaly, no carotid bruits Lungs: no use of accessory muscles, no dullness to percussion, decreased without rales or rhonchi  Cardiovascular: Rhythm regular, heart sounds  normal, no murmurs or gallops, no peripheral edema Abdomen: soft and non-tender, no hepatosplenomegaly, BS normal. Musculoskeletal: No deformities, no cyanosis or clubbing Neuro:  alert, non focal, no tremors       Assessment & Plan:

## 2022-07-12 ENCOUNTER — Ambulatory Visit (HOSPITAL_BASED_OUTPATIENT_CLINIC_OR_DEPARTMENT_OTHER)
Admission: RE | Admit: 2022-07-12 | Discharge: 2022-07-12 | Disposition: A | Discharge status: Home / Self Care | Payer: BC Managed Care – PPO | Source: Ambulatory Visit | Attending: Pulmonary Disease | Admitting: Pulmonary Disease

## 2022-07-12 DIAGNOSIS — J849 Interstitial pulmonary disease, unspecified: Secondary | ICD-10-CM | POA: Diagnosis present

## 2022-07-15 NOTE — Progress Notes (Signed)
Called and left a detailed msg on machine with results ok per DPR

## 2022-09-12 ENCOUNTER — Other Ambulatory Visit: Payer: Self-pay | Admitting: *Deleted

## 2022-09-12 DIAGNOSIS — J849 Interstitial pulmonary disease, unspecified: Secondary | ICD-10-CM

## 2022-09-13 ENCOUNTER — Encounter: Payer: Self-pay | Admitting: Pulmonary Disease

## 2022-09-13 ENCOUNTER — Ambulatory Visit (INDEPENDENT_AMBULATORY_CARE_PROVIDER_SITE_OTHER): Payer: BC Managed Care – PPO | Admitting: Pulmonary Disease

## 2022-09-13 VITALS — BP 122/66 | HR 67 | Temp 98.4°F | Ht 65.0 in | Wt 198.0 lb

## 2022-09-13 DIAGNOSIS — R0609 Other forms of dyspnea: Secondary | ICD-10-CM | POA: Diagnosis not present

## 2022-09-13 DIAGNOSIS — R499 Unspecified voice and resonance disorder: Secondary | ICD-10-CM | POA: Diagnosis not present

## 2022-09-13 DIAGNOSIS — J849 Interstitial pulmonary disease, unspecified: Secondary | ICD-10-CM

## 2022-09-13 LAB — PULMONARY FUNCTION TEST
DL/VA % pred: 107 %
DL/VA: 4.53 ml/min/mmHg/L
DLCO cor % pred: 89 %
DLCO cor: 18.88 ml/min/mmHg
DLCO unc % pred: 89 %
DLCO unc: 18.88 ml/min/mmHg
FEF 25-75 Post: 1.73 L/s
FEF 25-75 Pre: 1.26 L/s
FEF2575-%Change-Post: 37 %
FEF2575-%Pred-Post: 69 %
FEF2575-%Pred-Pre: 50 %
FEV1-%Change-Post: 9 %
FEV1-%Pred-Post: 74 %
FEV1-%Pred-Pre: 67 %
FEV1-Post: 2.01 L
FEV1-Pre: 1.83 L
FEV1FVC-%Change-Post: 0 %
FEV1FVC-%Pred-Pre: 89 %
FEV6-%Change-Post: 9 %
FEV6-%Pred-Post: 84 %
FEV6-%Pred-Pre: 77 %
FEV6-Post: 2.86 L
FEV6-Pre: 2.61 L
FEV6FVC-%Change-Post: 0 %
FEV6FVC-%Pred-Post: 103 %
FEV6FVC-%Pred-Pre: 103 %
FVC-%Change-Post: 9 %
FVC-%Pred-Post: 82 %
FVC-%Pred-Pre: 75 %
FVC-Post: 2.86 L
FVC-Pre: 2.61 L
Post FEV1/FVC ratio: 70 %
Post FEV6/FVC ratio: 100 %
Pre FEV1/FVC ratio: 70 %
Pre FEV6/FVC Ratio: 100 %
RV % pred: 100 %
RV: 2.01 L
TLC % pred: 91 %
TLC: 4.76 L

## 2022-09-13 NOTE — Patient Instructions (Addendum)
Lung testing does not reveal any abnormality.  x referral to ENT for change in voice  Discussed with PCP about cardiac testing as cause for shortness of breath

## 2022-09-13 NOTE — Patient Instructions (Signed)
Full PFT performed today. °

## 2022-09-13 NOTE — Progress Notes (Signed)
   Subjective:    Patient ID: Latoya Quinn, female    DOB: [birth year], 56 y.o.   MRN: [id number]  HPI  56 yo never smoker for FU of dyspnea on exertion   PMH -diabetes type 2 Essential tremor -started on propranolol Undifferentiated connective tissue disease -has seen rheumatology,?  RA versus Sjogren's on Plaquenil  Initial OV 07/04/22 >> Due to dyspnea on exertion and change in her voice, I was concerned about systemic effects of rheumatoid arthritis. We reviewed HRCT and PFTs today.  She continues to complain of shortness of breath and change in her voice and inability to talk for long hours Albuterol helps a little bit denies wheezing, coughing, chest pain or pedal edema   Significant tests/ events reviewed  07/04/22 On ambulation, she did not desaturate on exertion, heart rate increased from 74-1 02 and oxygen saturation dropped from 100 to 97%  09/2022 PFTs >> ratio 70, FEV1 67%, FVC 75%, improved to 82% postbronchodilator, not significant 9% improvement, TLC and DLCO normal   May 2021 PFTs showed restriction , normal spirometry, reduced TLC, DLCO mildly reduced  HRCT chest 07/2022 >> no ILD HRCT chest 05/2020 no ILD  Review of Systems neg for any significant sore throat, dysphagia, itching, sneezing, nasal congestion or excess/ purulent secretions, fever, chills, sweats, unintended wt loss, pleuritic or exertional cp, hempoptysis, orthopnea pnd or change in chronic leg swelling. Also denies presyncope, palpitations, heartburn, abdominal pain, nausea, vomiting, diarrhea or change in bowel or urinary habits, dysuria,hematuria, rash, arthralgias, visual complaints, headache, numbness weakness or ataxia.     Objective:   Physical Exam   Gen. Pleasant, obese, in no distress ENT - no lesions, no post nasal drip Neck: No JVD, no thyromegaly, no carotid bruits Lungs: no use of accessory muscles, no dullness to percussion, decreased without rales or rhonchi  Cardiovascular:  Rhythm regular, heart sounds  normal, no murmurs or gallops, no peripheral edema Musculoskeletal: No deformities, no cyanosis or clubbing , no tremors        Assessment & Plan:

## 2022-09-13 NOTE — Assessment & Plan Note (Signed)
Lung testing does not reveal any abnormality such as ILD.  I doubt she has significant airway obstruction but albuterol provides some subjective benefit so we will continue  x referral to ENT for change in voice  I encouraged her to discuss with PCP about cardiac testing as cause for shortness of breath

## 2022-09-13 NOTE — Progress Notes (Signed)
Full PFT performed today. °

## 2023-03-15 ENCOUNTER — Ambulatory Visit: Payer: BC Managed Care – PPO | Admitting: Adult Health

## 2024-02-08 ENCOUNTER — Emergency Department (HOSPITAL_COMMUNITY)
Admission: EM | Admit: 2024-02-08 | Discharge: 2024-02-08 | Disposition: A | Discharge status: Home / Self Care | Attending: Emergency Medicine | Admitting: Emergency Medicine

## 2024-02-08 ENCOUNTER — Emergency Department (HOSPITAL_COMMUNITY)

## 2024-02-08 ENCOUNTER — Encounter (HOSPITAL_COMMUNITY): Payer: Self-pay

## 2024-02-08 DIAGNOSIS — S60311A Abrasion of right thumb, initial encounter: Secondary | ICD-10-CM | POA: Diagnosis not present

## 2024-02-08 DIAGNOSIS — W19XXXA Unspecified fall, initial encounter: Secondary | ICD-10-CM | POA: Insufficient documentation

## 2024-02-08 DIAGNOSIS — M25571 Pain in right ankle and joints of right foot: Secondary | ICD-10-CM | POA: Diagnosis not present

## 2024-02-08 DIAGNOSIS — R519 Headache, unspecified: Secondary | ICD-10-CM | POA: Diagnosis not present

## 2024-02-08 DIAGNOSIS — M79644 Pain in right finger(s): Secondary | ICD-10-CM | POA: Diagnosis present

## 2024-02-08 MED ORDER — ACETAMINOPHEN 500 MG PO TABS
1000.0000 mg | ORAL_TABLET | Freq: Once | ORAL | Status: AC
  Administered 2024-02-08: 1000 mg via ORAL
  Filled 2024-02-08: qty 2

## 2024-02-08 MED ORDER — BACITRACIN ZINC 500 UNIT/GM EX OINT
TOPICAL_OINTMENT | Freq: Two times a day (BID) | CUTANEOUS | Status: DC
  Filled 2024-02-08: qty 1.8

## 2024-02-08 NOTE — ED Provider Notes (Signed)
 [place] EMERGENCY DEPARTMENT AT [facility] Provider Note   CSN: [id number] Arrival date & time: 02/08/24  1403     History  Chief Complaint  Patient presents with  . Fall  . Hand Pain    Latoya Quinn is a 58 y.o. female otherwise healthy presents following mechanical fall.  Patient states that the wind was blowing hard and knocked her over.  Patient states she fell and hit her head.  Her friend states she lost consciousness.  Patient is unsure.  She is not on blood thinners.  She endorses headache without dizziness, blurry vision, extremity weakness or difficulty ambulating.  She complains of right thumb pain with associated abrasion.  Is up-to-date on tetanus.  Additionally, complains of left ankle pain.  She is able to bear weight with some discomfort.  History reviewed. No pertinent past medical history.    Fall  Hand Pain      Home Medications Prior to Admission medications   Medication Sig Start Date End Date Taking? Authorizing Provider  albuterol (VENTOLIN HFA) 108 (90 Base) MCG/ACT inhaler SMARTSIG:2 Puff(s) By Mouth Every 6 Hours PRN 01/21/22   [provider]  DULoxetine (CYMBALTA) 60 MG capsule Take 60 mg by mouth daily. 05/04/22   [provider]  hydroxychloroquine (PLAQUENIL) 200 MG tablet Take 400 mg by mouth daily. 06/04/18   [provider]  meloxicam (MOBIC) 15 MG tablet Take 15 mg by mouth daily as needed. 05/04/22   [provider]  metFORMIN (GLUCOPHAGE-XR) 500 MG 24 hr tablet Take 1,000 mg by mouth daily. 05/04/22   [provider]  montelukast (SINGULAIR) 10 MG tablet Take 10 mg by mouth daily. 04/29/22   [provider]  omeprazole (PRILOSEC) 20 MG capsule Take 20 mg by mouth daily. 04/29/22   [provider]  propranolol ER (INDERAL LA) 60 MG 24 hr capsule Take 60 mg by mouth daily. 01/21/22   [provider]      Allergies    Penicillins and Sulfa antibiotics    Review  of Systems   Review of Systems  Musculoskeletal:  Positive for arthralgias and myalgias.    Physical Exam Updated Vital Signs BP 120/83   Pulse 73   Temp 98.1 F (36.7 C) (Oral)   Resp 17   Ht 5\' 5"  (1.651 m)   Wt 89.4 kg   SpO2 99%   BMI 32.78 kg/m  Physical Exam  ED Results / Procedures / Treatments   Labs (all labs ordered are listed, but only abnormal results are displayed) Labs Reviewed - No data to display  EKG None  Radiology CT Head Wo Contrast Result Date: 02/08/2024 CLINICAL DATA:  Trauma, fall, facial abrasion EXAM: CT HEAD WITHOUT CONTRAST CT MAXILLOFACIAL WITHOUT CONTRAST CT CERVICAL SPINE WITHOUT CONTRAST TECHNIQUE: Multidetector CT imaging of the head, cervical spine, and maxillofacial structures were performed using the standard protocol without intravenous contrast. Multiplanar CT image reconstructions of the cervical spine and maxillofacial structures were also generated. RADIATION DOSE REDUCTION: This exam was performed according to the departmental dose-optimization program which includes automated exposure control, adjustment of the mA and/or kV according to patient size and/or use of iterative reconstruction technique. COMPARISON:  01/04/2022 FINDINGS: CT HEAD FINDINGS Brain: No evidence of acute infarction, hemorrhage, hydrocephalus, extra-axial collection or mass lesion/mass effect. Vascular: No hyperdense vessel or unexpected calcification. CT FACIAL BONES FINDINGS Skull: Normal. Negative for fracture or focal lesion. Facial bones: No displaced fractures or dislocations. Sinuses/Orbits: No acute finding.  Other: None. CT CERVICAL SPINE FINDINGS Alignment: Positional straightening of the normal cervical lordosis. Skull base and vertebrae: No acute fracture. No primary bone lesion or focal pathologic process. Soft tissues and spinal canal: No prevertebral fluid or swelling. No visible canal hematoma. Disc levels:  Mild multilevel cervical disc degenerative disease.  Upper chest: Negative. Other: None. IMPRESSION: 1. No acute intracranial pathology. 2. No displaced fractures or dislocations of the facial bones. 3. No fracture or static subluxation of the cervical spine. 4. Mild multilevel cervical disc degenerative disease. Electronically Signed   By: Jearld Lesch M.D.   On: 02/08/2024 16:46   CT Maxillofacial Wo Contrast Result Date: 02/08/2024 CLINICAL DATA:  Trauma, fall, facial abrasion EXAM: CT HEAD WITHOUT CONTRAST CT MAXILLOFACIAL WITHOUT CONTRAST CT CERVICAL SPINE WITHOUT CONTRAST TECHNIQUE: Multidetector CT imaging of the head, cervical spine, and maxillofacial structures were performed using the standard protocol without intravenous contrast. Multiplanar CT image reconstructions of the cervical spine and maxillofacial structures were also generated. RADIATION DOSE REDUCTION: This exam was performed according to the departmental dose-optimization program which includes automated exposure control, adjustment of the mA and/or kV according to patient size and/or use of iterative reconstruction technique. COMPARISON:  01/04/2022 FINDINGS: CT HEAD FINDINGS Brain: No evidence of acute infarction, hemorrhage, hydrocephalus, extra-axial collection or mass lesion/mass effect. Vascular: No hyperdense vessel or unexpected calcification. CT FACIAL BONES FINDINGS Skull: Normal. Negative for fracture or focal lesion. Facial bones: No displaced fractures or dislocations. Sinuses/Orbits: No acute finding. Other: None. CT CERVICAL SPINE FINDINGS Alignment: Positional straightening of the normal cervical lordosis. Skull base and vertebrae: No acute fracture. No primary bone lesion or focal pathologic process. Soft tissues and spinal canal: No prevertebral fluid or swelling. No visible canal hematoma. Disc levels:  Mild multilevel cervical disc degenerative disease. Upper chest: Negative. Other: None. IMPRESSION: 1. No acute intracranial pathology. 2. No displaced fractures or  dislocations of the facial bones. 3. No fracture or static subluxation of the cervical spine. 4. Mild multilevel cervical disc degenerative disease. Electronically Signed   By: Jearld Lesch M.D.   On: 02/08/2024 16:46   CT Cervical Spine Wo Contrast Result Date: 02/08/2024 CLINICAL DATA:  Trauma, fall, facial abrasion EXAM: CT HEAD WITHOUT CONTRAST CT MAXILLOFACIAL WITHOUT CONTRAST CT CERVICAL SPINE WITHOUT CONTRAST TECHNIQUE: Multidetector CT imaging of the head, cervical spine, and maxillofacial structures were performed using the standard protocol without intravenous contrast. Multiplanar CT image reconstructions of the cervical spine and maxillofacial structures were also generated. RADIATION DOSE REDUCTION: This exam was performed according to the departmental dose-optimization program which includes automated exposure control, adjustment of the mA and/or kV according to patient size and/or use of iterative reconstruction technique. COMPARISON:  01/04/2022 FINDINGS: CT HEAD FINDINGS Brain: No evidence of acute infarction, hemorrhage, hydrocephalus, extra-axial collection or mass lesion/mass effect. Vascular: No hyperdense vessel or unexpected calcification. CT FACIAL BONES FINDINGS Skull: Normal. Negative for fracture or focal lesion. Facial bones: No displaced fractures or dislocations. Sinuses/Orbits: No acute finding. Other: None. CT CERVICAL SPINE FINDINGS Alignment: Positional straightening of the normal cervical lordosis. Skull base and vertebrae: No acute fracture. No primary bone lesion or focal pathologic process. Soft tissues and spinal canal: No prevertebral fluid or swelling. No visible canal hematoma. Disc levels:  Mild multilevel cervical disc degenerative disease. Upper chest: Negative. Other: None. IMPRESSION: 1. No acute intracranial pathology. 2. No displaced fractures or dislocations of the facial bones. 3. No fracture or static subluxation of the cervical spine. 4. Mild multilevel  cervical disc  degenerative disease. Electronically Signed   By: Jearld Lesch M.D.   On: 02/08/2024 16:46    Procedures Procedures    Medications Ordered in ED Medications  acetaminophen (TYLENOL) tablet 1,000 mg (1,000 mg Oral Given 02/08/24 1623)    ED Course/ Medical Decision Making/ A&P                                 Medical Decision Making Amount and/or Complexity of Data Reviewed Radiology: ordered.  Risk OTC drugs.   This patient presents to the ED with chief complaint(s) of mechanical fall.  The complaint involves an extensive differential diagnosis and also carries with it a high risk of complications and morbidity.   pertinent past medical history as listed in HPI  The differential diagnosis includes  Hemorrhage, fracture, dislocation, sprain, vascular/nerve/tendon injury  Additional history obtained: Records reviewed Care Everywhere/External Records  Initial Assessment:   Hemodynamically stable, nontoxic-appearing patient presenting following mechanical fall.  Primarily complaining of pain to left ankle, right thumb, right infraorbital region and headache.  There was some concern from witnesses that she lost consciousness.  On exam she has no neurodeficits.  Overall low suspicion for intracranial abnormality, however given age, concern for LOC and new headache will obtain CT imaging.  Right thumb has a mild abrasion with some associated tenderness.  Maintains full range of motion of thumb.  She has lateral ankle joint line tenderness.  Maintains full range of motion of ankle.  Overall she is NVI.  Low suspicion for vascular versus nerve injury.  Independent ECG interpretation:  none  Independent labs interpretation:  The following labs were independently interpreted:  none  Independent visualization and interpretation of imaging: I independently visualized the following imaging with scope of interpretation limited to determining acute life threatening conditions  related to emergency care: X-ray right hand and left ankle, which revealed no acute abnormality CT head/maxillofacial/cervical spine with no acute abnormality  Treatment and Reassessment: Patient given Tylenol 1000 mg following first assessment  Consultations obtained:   none  Disposition:   Patient will be discharged home.  Encouraged follow-up with primary care doctor.  The patient has been appropriately medically screened and/or stabilized in the ED. I have low suspicion for any other emergent medical condition which would require further screening, evaluation or treatment in the ED or require inpatient management. At time of discharge the patient is hemodynamically stable and in no acute distress. I have discussed work-up results and diagnosis with patient and answered all questions. Patient is agreeable with discharge plan. We discussed strict return precautions for returning to the emergency department and they verbalized understanding.     Social Determinants of Health:   none  This note was dictated with voice recognition software.  Despite best efforts at proofreading, errors may have occurred which can change the documentation meaning.           Final Clinical Impression(s) / ED Diagnoses Final diagnoses:  Fall, initial encounter    Rx / DC Orders ED Discharge Orders     None         Fabienne Bruns 02/08/24 2144    Rolan Bucco, MD 02/09/24 [phone]

## 2024-02-08 NOTE — Discharge Instructions (Addendum)
 You were evaluated in the emergency room following a fall.  Your imaging did not show any acute abnormality.  Please follow with your primary care doctor if symptoms persist.  You may use Tylenol 1000 mg up to 3 times a day for pain.

## 2024-02-08 NOTE — Progress Notes (Signed)
 Orthopedic Tech Progress Note Patient Details:  Latoya Quinn [birth year] [id number]  Ortho Devices Type of Ortho Device: ASO Ortho Device/Splint Location: left Ortho Device/Splint Interventions: Ordered, Application, Adjustment   Post Interventions Patient Tolerated: Well Instructions Provided: Adjustment of device, Care of device  Kizzie Fantasia 02/08/2024, 5:50 PM

## 2024-02-08 NOTE — ED Triage Notes (Signed)
 Pt biba had a fall from the wind. Left foot pain, abrasion to R face, right cheek. NO LOC. Reports R hand pain. No thinners.

## 2024-06-20 ENCOUNTER — Other Ambulatory Visit: Payer: Self-pay | Admitting: Urology

## 2024-07-03 ENCOUNTER — Encounter (HOSPITAL_COMMUNITY): Payer: Self-pay

## 2024-07-03 NOTE — Patient Instructions (Addendum)
 SURGICAL WAITING ROOM VISITATION Patients having surgery or a procedure may have no more than 2 support people in the waiting area - these visitors may rotate.    Children under the age of 70 must have an adult with them who is not the patient.  If the patient needs to stay at the hospital during part of their recovery, the visitor guidelines for inpatient rooms apply. Pre-op nurse will coordinate an appropriate time for 1 support person to accompany patient in pre-op.  This support person may not rotate.    Please refer to the [facility] website for the visitor guidelines for Inpatients (after your surgery is over and you are in a regular room).       Your procedure is scheduled on: 07-23-24   Report to [facility] Main Entrance    Report to admitting at 6:30 AM   Call this number if you have problems the morning of surgery [phone]   Do not eat food or drink liquids :After Midnight.          If you have questions, please contact your surgeon's office.   FOLLOW  ANY ADDITIONAL PRE OP INSTRUCTIONS YOU RECEIVED FROM YOUR SURGEON'S OFFICE!!!     Oral Hygiene is also important to reduce your risk of infection.                                    Remember - BRUSH YOUR TEETH THE MORNING OF SURGERY WITH YOUR REGULAR TOOTHPASTE   Do NOT smoke after Midnight   Take these medicines the morning of surgery with A SIP OF WATER:    Duloxetine   Singulair   Omeprazole   Propranolol   Okay to use inhalers, nasal spray  Stop all vitamins and herbal supplements 7 days before surgery  How to Manage Your Diabetes Before and After Surgery  Why is it important to control my blood sugar before and after surgery? Improving blood sugar levels before and after surgery helps healing and can limit problems. A way of improving blood sugar control is eating a healthy diet by:  Eating less sugar and carbohydrates  Increasing activity/exercise  Talking with your doctor about reaching  your blood sugar goals High blood sugars (greater than 180 mg/dL) can raise your risk of infections and slow your recovery, so you will need to focus on controlling your diabetes during the weeks before surgery. Make sure that the doctor who takes care of your diabetes knows about your planned surgery including the date and location.  How do I manage my blood sugar before surgery? Check your blood sugar at least 4 times a day, starting 2 days before surgery, to make sure that the level is not too high or low. Check your blood sugar the morning of your surgery when you wake up and every 2 hours until you get to the Short Stay unit. If your blood sugar is less than 70 mg/dL, you will need to treat for low blood sugar: Do not take insulin. Treat a low blood sugar (less than 70 mg/dL) with  cup of clear juice (cranberry or apple), 4 glucose tablets, OR glucose gel. Recheck blood sugar in 15 minutes after treatment (to make sure it is greater than 70 mg/dL). If your blood sugar is not greater than 70 mg/dL on recheck, call [phone] for further instructions. Report your blood sugar to the short stay nurse when  you get to Short Stay.  If you are admitted to the hospital after surgery: Your blood sugar will be checked by the staff and you will probably be given insulin after surgery (instead of oral diabetes medicines) to make sure you have good blood sugar levels. The goal for blood sugar control after surgery is 80-180 mg/dL.   WHAT DO I DO ABOUT MY DIABETES MEDICATION?  Do not take oral diabetes medicines (pills) the morning of surgery.  Hold Jardiance 3 days before surgery (do not take after 07-19-24)  DO NOT TAKE THE FOLLOWING 7 DAYS PRIOR TO SURGERY: Ozempic, Wegovy, Rybelsus (Semaglutide), Byetta (exenatide), Bydureon (exenatide ER), Victoza, Saxenda (liraglutide), or Trulicity (dulaglutide) Mounjaro (Tirzepatide) Adlyxin (Lixisenatide), Polyethylene Glycol Loxenatide.  Reviewed and  Endorsed by [facility] Patient Education Committee, August 2015                              You may not have any metal on your body including hair pins, jewelry, and body piercing             Do not wear make-up, lotions, powders, perfumes or deodorant  Do not wear nail polish including gel and S&S, artificial/acrylic nails, or any other type of covering on natural nails including finger and toenails. If you have artificial nails, gel coating, etc. that needs to be removed by a nail salon please have this removed prior to surgery or surgery may need to be canceled/ delayed if the surgeon/ anesthesia feels like they are unable to be safely monitored.   Do not shave  48 hours prior to surgery.      Do not bring valuables to the hospital. [place] IS NOT RESPONSIBLE   FOR VALUABLES.   Contacts, dentures or bridgework may not be worn into surgery.  DO NOT BRING YOUR HOME MEDICATIONS TO THE HOSPITAL. PHARMACY WILL DISPENSE MEDICATIONS LISTED ON YOUR MEDICATION LIST TO YOU DURING YOUR ADMISSION IN THE HOSPITAL!    Patients discharged on the day of surgery will not be allowed to drive home.  Someone NEEDS to stay with you for the first 24 hours after anesthesia.              Please read over the following fact sheets you were given: IF YOU HAVE QUESTIONS ABOUT YOUR PRE-OP INSTRUCTIONS PLEASE CALL [phone] [facility]  If you received a COVID test during your pre-op visit  it is requested that you wear a mask when out in public, stay away from anyone that may not be feeling well and notify your surgeon if you develop symptoms. If you test positive for Covid or have been in contact with anyone that has tested positive in the last 10 days please notify you surgeon.  [place] - Preparing for Surgery Before surgery, you can play an important role.  Because skin is not sterile, your skin needs to be as free of germs as possible.  You can reduce the number of germs on your skin by washing with CHG  (chlorahexidine gluconate) soap before surgery.  CHG is an antiseptic cleaner which kills germs and bonds with the skin to continue killing germs even after washing. Please DO NOT use if you have an allergy to CHG or antibacterial soaps.  If your skin becomes reddened/irritated stop using the CHG and inform your nurse when you arrive at Short Stay. Do not shave (including legs and underarms) for at least 48 hours prior  to the first CHG shower.  You may shave your face/neck.  Please follow these instructions carefully:  1.  Shower with CHG Soap the night before surgery and the  morning of surgery.  2.  If you choose to wash your hair, wash your hair first as usual with your normal  shampoo.  3.  After you shampoo, rinse your hair and body thoroughly to remove the shampoo.                             4.  Use CHG as you would any other liquid soap.  You can apply chg directly to the skin and wash.  Gently with a scrungie or clean washcloth.  5.  Apply the CHG Soap to your body ONLY FROM THE NECK DOWN.   Do   not use on face/ open                           Wound or open sores. Avoid contact with eyes, ears mouth and   genitals (private parts).                       Wash face,  Genitals (private parts) with your normal soap.             6.  Wash thoroughly, paying special attention to the area where your    surgery  will be performed.  7.  Thoroughly rinse your body with warm water from the neck down.  8.  DO NOT shower/wash with your normal soap after using and rinsing off the CHG Soap.                9.  Pat yourself dry with a clean towel.            10.  Wear clean pajamas.            11.  Place clean sheets on your bed the night of your first shower and do not  sleep with pets. Day of Surgery : Do not apply any lotions/deodorants the morning of surgery.  Please wear clean clothes to the hospital/surgery center.  FAILURE TO FOLLOW THESE INSTRUCTIONS MAY RESULT IN THE CANCELLATION OF YOUR  SURGERY  PATIENT SIGNATURE_________________________________  NURSE SIGNATURE__________________________________  ________________________________________________________________________

## 2024-07-09 NOTE — Progress Notes (Signed)
  Date of COVID positive in last 90 days:  No  PCP - Luke Manns, MD Cardiologist - Youlanda Juba, MD Pulmonologist - Harden Staff, MD  Chest x-ray - N/A EKG - 07-10-24 Epic Stress Test - N/A ECHO - 12-15-22 [facility] Cardiac Cath - N/A Pacemaker/ICD device last checked:  N/A Spinal Cord Stimulator:  N/A Coronary CT - 11-04-22 [facility]  Bowel Prep - N/A  Sleep Study - N/A CPAP -   A1c 7.0 07-03-24 Fasting Blood Sugar -  Checks Blood Sugar - does not check   Last dose of GLP1 agonist-  N/A GLP1 instructions:  Do not take after     Jardiance Last dose of SGLT-2 inhibitors-  N/A SGLT-2 instructions:  Do not take after 07-19-24   Blood Thinner Instructions: N/A Aspirin Instructions: Last Dose:  Activity level:  Can go up a flight of stairs and perform activities of daily living without stopping and without symptoms of chest pain or shortness of breath.  Anesthesia review:  Hx of atypical chest pain and DOE, DM.  Patient states that the DOE has improved with weight loss.  Denies any recent chest pain.  Patient denies shortness of breath, fever, cough and chest pain at PAT appointment  Patient verbalized understanding of instructions that were given to them at the PAT appointment. Patient was also instructed that they will need to review over the PAT instructions again at home before surgery.

## 2024-07-10 ENCOUNTER — Other Ambulatory Visit: Payer: Self-pay

## 2024-07-10 ENCOUNTER — Encounter (HOSPITAL_COMMUNITY)
Admission: RE | Admit: 2024-07-10 | Discharge: 2024-07-10 | Disposition: A | Discharge status: Home / Self Care | Source: Ambulatory Visit | Attending: Urology | Admitting: Urology

## 2024-07-10 ENCOUNTER — Encounter (HOSPITAL_COMMUNITY): Payer: Self-pay

## 2024-07-10 VITALS — BP 129/93 | HR 77 | Temp 98.2°F | Resp 16 | Ht 65.0 in | Wt 175.0 lb

## 2024-07-10 DIAGNOSIS — F419 Anxiety disorder, unspecified: Secondary | ICD-10-CM | POA: Diagnosis not present

## 2024-07-10 DIAGNOSIS — G250 Essential tremor: Secondary | ICD-10-CM | POA: Diagnosis not present

## 2024-07-10 DIAGNOSIS — N202 Calculus of kidney with calculus of ureter: Secondary | ICD-10-CM | POA: Insufficient documentation

## 2024-07-10 DIAGNOSIS — K219 Gastro-esophageal reflux disease without esophagitis: Secondary | ICD-10-CM | POA: Insufficient documentation

## 2024-07-10 DIAGNOSIS — Z7984 Long term (current) use of oral hypoglycemic drugs: Secondary | ICD-10-CM | POA: Diagnosis not present

## 2024-07-10 DIAGNOSIS — Z01818 Encounter for other preprocedural examination: Secondary | ICD-10-CM | POA: Diagnosis not present

## 2024-07-10 DIAGNOSIS — Z79899 Other long term (current) drug therapy: Secondary | ICD-10-CM | POA: Diagnosis not present

## 2024-07-10 DIAGNOSIS — Z01812 Encounter for preprocedural laboratory examination: Secondary | ICD-10-CM | POA: Diagnosis present

## 2024-07-10 DIAGNOSIS — E119 Type 2 diabetes mellitus without complications: Secondary | ICD-10-CM | POA: Diagnosis not present

## 2024-07-10 DIAGNOSIS — M069 Rheumatoid arthritis, unspecified: Secondary | ICD-10-CM | POA: Insufficient documentation

## 2024-07-10 DIAGNOSIS — Z01810 Encounter for preprocedural cardiovascular examination: Secondary | ICD-10-CM | POA: Diagnosis present

## 2024-07-10 HISTORY — DX: Headache, unspecified: R51.9

## 2024-07-10 HISTORY — DX: Type 2 diabetes mellitus without complications: E11.9

## 2024-07-10 HISTORY — DX: Personal history of urinary calculi: Z87.442

## 2024-07-10 HISTORY — DX: Gastro-esophageal reflux disease without esophagitis: K21.9

## 2024-07-10 HISTORY — DX: Unspecified osteoarthritis, unspecified site: M19.90

## 2024-07-10 HISTORY — DX: Hyperlipidemia, unspecified: E78.5

## 2024-07-10 HISTORY — DX: Anxiety disorder, unspecified: F41.9

## 2024-07-10 LAB — BASIC METABOLIC PANEL WITH GFR
Anion gap: 8 (ref 5–15)
BUN: 15 mg/dL (ref 6–20)
CO2: 26 mmol/L (ref 22–32)
Calcium: 9.4 mg/dL (ref 8.9–10.3)
Chloride: 109 mmol/L (ref 98–111)
Creatinine, Ser: 0.94 mg/dL (ref 0.44–1.00)
GFR, Estimated: >60 mL/min (ref >60)
Glucose, Bld: 149 mg/dL — ABNORMAL HIGH (ref 70–99)
Potassium: 4.4 mmol/L (ref 3.5–5.1)
Sodium: 143 mmol/L (ref 135–145)

## 2024-07-10 LAB — GLUCOSE, CAPILLARY: Glucose-Capillary: 135 mg/dL — ABNORMAL HIGH (ref 70–99)

## 2024-07-12 ENCOUNTER — Encounter (HOSPITAL_COMMUNITY): Payer: Self-pay

## 2024-07-12 NOTE — Progress Notes (Signed)
 Case: [id number] Date/Time: 07/23/24 0830   Procedures:      CYSTOSCOPY/URETEROSCOPY/HOLMIUM LASER/STENT PLACEMENT (Left)     CYSTOSCOPY/RETROGRADE/URETEROSCOPY (Left)   Anesthesia type: General   Diagnosis: Calculus of kidney [N20.0]   Pre-op diagnosis: LEFT URETERAL STONE AND LEFT RENAL STONE   Location: [facility] ROOM 03 / [facility]   Surgeons: Shane Steffan BROCKS, MD       DISCUSSION: Latoya Quinn is a 58 yo with PMH of DM, GERD, essential tremor, RA, connective tissue disease, anxiety.  Patient follows with PCP. Seen on 7/30 for routine f/u. BP controlled. DM controlled (A1c 7.0.). Also has essential tremor, takes Propranolol. Considering neurology referral. All other issues stable.   Follows with Rheumatology for RA and unspecified connective tissue disease. Takes Plaquenil.  Follows with ENT for dystonia. Last received botox injection on 05/03/24. Also followed by SLP.  VS: BP (!) 129/93   Pulse 77   Temp 36.8 C (Oral)   Resp 16   Ht 5' 5 (1.651 m)   Wt 79.4 kg   SpO2 100%   BMI 29.12 kg/m   PROVIDERS: Rena Luke POUR, MD   LABS: Labs reviewed: Acceptable for surgery. (all labs ordered are listed, but only abnormal results are displayed)  Labs Reviewed  BASIC METABOLIC PANEL WITH GFR - Abnormal; Notable for the following components:      Result Value   Glucose, Bld 149 (*)    All other components within normal limits  GLUCOSE, CAPILLARY - Abnormal; Notable for the following components:   Glucose-Capillary 135 (*)    All other components within normal limits     IMAGES: CT Chest 07/12/2022:  IMPRESSION: 1. No evidence of fibrotic interstitial lung disease or acute airspace opacity. 2. Nonobstructing stones of the left kidney.    EKG 07/10/24:  Normal sinus rhythm, rat 78 Left ventricular hypertrophy Nonspecific ST changes  CV:  Echo 12/15/2022 ([facility]):  Impression Left Ventricle: Wall motion is normal.   Left Ventricle: Systolic function is normal. EF:  60-63%.   Left Ventricle: Doppler parameters indicate normal diastolic function.   Left Atrium: Injection of agitated saline documents no interatrial shunt.   Tricuspid Valve: Unable to assess RVSP accurately without IVC visualization, but considering TR jet velocity only 2.1 with per second, RVSP is likely within normal limits.  Past Medical History:  Diagnosis Date   Anxiety    Arthritis    Rheumatoid   Diabetes mellitus without complication (HCC)    GERD (gastroesophageal reflux disease)    Headache    History of kidney stones    Hyperlipemia     Past Surgical History:  Procedure Laterality Date   CATARACT EXTRACTION W/ INTRAOCULAR LENS IMPLANT Bilateral    CESAREAN SECTION     COLONOSCOPY     KIDNEY STONE SURGERY      MEDICATIONS:  albuterol (VENTOLIN HFA) 108 (90 Base) MCG/ACT inhaler   dicyclomine (BENTYL) 20 MG tablet   DULoxetine (CYMBALTA) 60 MG capsule   fluticasone (FLONASE) 50 MCG/ACT nasal spray   hydroxychloroquine (PLAQUENIL) 200 MG tablet   JARDIANCE 25 MG TABS tablet   meloxicam (MOBIC) 15 MG tablet   metFORMIN (GLUCOPHAGE-XR) 500 MG 24 hr tablet   montelukast (SINGULAIR) 10 MG tablet   omeprazole (PRILOSEC) 20 MG capsule   polyethylene glycol powder (GAVILAX) 17 GM/SCOOP powder   propranolol ER (INDERAL LA) 80 MG 24 hr capsule   rosuvastatin (CRESTOR) 10 MG tablet   No current facility-administered medications for this encounter.   Burnard  CHRISTELLA Odis RIGGERS [facility] Stay/Anesthesiology [facility] Phone [phone] 07/12/2024 2:53 PM

## 2024-07-12 NOTE — Anesthesia Preprocedure Evaluation (Addendum)
 Anesthesia Evaluation  Patient identified by MRN, date of birth, ID band Patient awake    Reviewed: Allergy & Precautions, H&P , NPO status , Patient's Chart, lab work & pertinent test results  Airway Mallampati: II   Neck ROM: full    Dental   Pulmonary    breath sounds clear to auscultation       Cardiovascular negative cardio ROS  Rhythm:regular Rate:Normal     Neuro/Psych  Headaches  Anxiety        GI/Hepatic ,GERD  ,,  Endo/Other  diabetes, Type 2    Renal/GU stones     Musculoskeletal  (+) Arthritis ,    Abdominal   Peds  Hematology   Anesthesia Other Findings   Reproductive/Obstetrics                              Anesthesia Physical Anesthesia Plan  ASA: 2  Anesthesia Plan: General   Post-op Pain Management:    Induction: Intravenous  PONV Risk Score and Plan: 3 and Ondansetron , Dexamethasone , Midazolam  and Treatment may vary due to age or medical condition  Airway Management Planned: LMA  Additional Equipment:   Intra-op Plan:   Post-operative Plan: Extubation in OR  Informed Consent: I have reviewed the patients History and Physical, chart, labs and discussed the procedure including the risks, benefits and alternatives for the proposed anesthesia with the patient or authorized representative who has indicated his/her understanding and acceptance.     Dental advisory given  Plan Discussed with: CRNA, Anesthesiologist and Surgeon  Anesthesia Plan Comments: (See PAT note from 8/6)         Anesthesia Quick Evaluation

## 2024-07-21 NOTE — H&P (Signed)
 58 year old female who had a CT completed on 05/24/2024 which demonstrated left hydronephrosis and a distal left ureteral stone 7 mm. CT was done at [facility]. She previously was having concerns of blood in her urine. She had some suprapubic pain. She has a history of kidney stones where she had a previous stent placement and lithotripsy.   PMH: No Cards, or Pulm disease, has been cleared, no blood thinners. DM2, metforman and jardiance   Left nephrolithiasis:  06/19/2024: Patient does have a history of ESWL done 8 years ago. Last stone passed many years ago. CT did today shows left distal ureteral stone as well as multiple left renal pelvis stones, right lower pole renal pelvis stone.  07/23/24: L URS today   Gross hematuria:  06/19/2024: GH a couple months ago. Pt was having pain. Pt has low back pain. Pt seen by PCP no UTI. No hx of tobacco use. 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 No FH of Kidney or bladder Ca, Father had PCa. No Hx of colon Ca.  07/23/24: cysto to eval for bladder Ca    ALLERGIES: Penicillins Sulfa Drugs    MEDICATIONS: No Reported Medications     GU PSH: No GU PSH      PSH Notes: Cesarean Section   NON-GU PSH: Cesarean Delivery Only - 2017         GU PMH: Chronic cystitis (w/o hematuria), Chronic cystitis - 2017 Renal calculus, Nephrolithiasis - 2017    NON-GU PMH: Encounter for general adult medical examination without abnormal findings, Encounter for preventive health examination - 2017 Personal history of other diseases of the musculoskeletal system and connective tissue, History of arthritis - 2017    FAMILY HISTORY: 1 Daughter - Other 1 son - Other Blood In Urine - Runs in Family Dementia - Runs in Family Kidney Stones - Runs in Family Prostate Cancer - Runs In Family   SOCIAL HISTORY: Marital Status: Divorced Preferred Language: English; Ethnicity: Not Hispanic Or Latino; Race: Black or African American Current Smoking Status: Patient has never smoked.   <DIV'  Tobacco Use Assessment Completed:  Used Tobacco in last 30 days? Does not use smokeless tobacco. Has never drank.  Does not use drugs. Drinks 3 caffeinated drinks per day. Has not had a blood transfusion. </DIV'    Notes: Occupation, Alcohol use, Number of children, Never a smoker, Divorced, Caffeine use   REVIEW OF SYSTEMS:     GU Review Female:  Patient reports frequent urination and get up at night to urinate. Patient denies hard to postpone urination, burning /pain with urination, leakage of urine, stream starts and stops, trouble starting your stream, have to strain to urinate, and being pregnant.    Gastrointestinal (Upper):  Patient denies nausea, vomiting, and indigestion/ heartburn.    Gastrointestinal (Lower):  Patient reports constipation. Patient denies diarrhea.    Constitutional:  Patient reports fatigue. Patient denies fever, night sweats, and weight loss.    Skin:  Patient denies skin rash/ lesion and itching.    Eyes:  Patient denies blurred vision and double vision.    Ears/ Nose/ Throat:  Patient denies sore throat and sinus problems.    Hematologic/Lymphatic:  Patient denies swollen glands and easy bruising.    Cardiovascular:  Patient denies chest pains and leg swelling.    Respiratory:  Patient denies cough and shortness of breath.    Endocrine:  Patient denies excessive thirst.    Musculoskeletal:  Patient reports back pain and joint pain.  Neurological:  Patient denies headaches and dizziness.    Psychologic:  Patient reports anxiety. Patient denies depression.    VITAL SIGNS:       06/19/2024 08:23 AM     BP 125/83 mmHg     Pulse 78 /min     Temperature 97.1 F / 36.1 C     MULTI-SYSTEM PHYSICAL EXAMINATION:      Constitutional: Well-nourished. No physical deformities. Normally developed. Good grooming.     Respiratory: No labored breathing, no use of accessory muscles.      Cardiovascular: Normal temperature, normal extremity pulses, no swelling,  no varicosities.     Skin: No paleness, no jaundice, no cyanosis. No lesion, no ulcer, no rash.     Musculoskeletal: Normal gait and station of head and neck.            Complexity of Data:   Source Of History:  Patient  Records Review:  Previous Patient Records  Urine Test Review:  Urinalysis  X-Ray Review: C.T. Stone Protocol: Reviewed Films. Discussed With Patient. Left distal ureteral stone x 2 multiple layering stones throughout the left kidney would likely cause problems in the future. Mild left hydronephrosis right-sided lower pole stone no right-sided hydronephrosis. Stone is visualized on KUB/scout film.    PROCEDURES:    C.T. Urogram - [id number]      Patient confirmed No Neulasta OnPro Device.    Visit Complexity - G2211      Urinalysis w/Scope  Dipstick Dipstick Cont'd Micro  Color: Yellow Bilirubin: Neg mg/dL WBC/hpf: 0 - 5/hpf  Appearance: Cloudy Ketones: Neg mg/dL RBC/hpf: >39/yeq  Specific Gravity: 1.025 Blood: 3+ ery/uL Bacteria: Few (10-25/hpf)  pH: 6.0 Protein: Neg mg/dL Cystals: NS (Not Seen)  Glucose: 2+ mg/dL Urobilinogen: 0.2 mg/dL Casts: NS (Not Seen)   Nitrites: Neg Trichomonas: Not Present   Leukocyte Esterase: 1+ leu/uL Mucous: Not Present    Epithelial Cells: 0 - 5/hpf    Yeast: NS (Not Seen)    Sperm: Not Present    ASSESSMENT:     ICD-10 Details  1 GU:  Renal calculus - N20.0 Undiagnosed New Problem  2  Gross hematuria - R31.0 Undiagnosed New Problem   PLAN:   Orders  Labs BMP, Urine Culture  X-Rays: C.T. Stone Protocol Without I.V. Contrast  X-Ray Notes: . History:  Hematuria: Yes/No  Patient to see MD after exam: Yes/No  Previous exam: CT / IVP/ US / KUB/ None  When:  Where:  Diabetic: Yes/ No  BUN/ Creatinine:  Date of last BUN Creatinine:  Weight in pounds:  Allergy- IV Contrast: Yes/ No  Conflicting diabetic meds: Yes/ No  Diabetic Meds:  Prior Authorization #: NPCR per [place] Case# [id number]    Schedule     Document  Letter(s):  Created for Patient: Clinical Summary   Notes:  Hematuria:   Nephrolithiasis: Patient has left ureteral stone as well as left renal pelvis stone. Right lower pole renal pelvis stone. Chem-7 and Ucx ordered today. Discussed options of management including ESWL, ureteroscopy and PCNL discussed the benefits and risks of all. Patient preferred to do ureteroscopy. Plan to proceed today.   We discussed the risk benefits and alternatives to ureteroscopy. This includes bleeding, infection, damage to surrounding structures including the urethra, bladder, ureter, and kidney. With these possible injuries resulting in need for intervention in the future. We discussed inability to remove all the stone and requiring follow-up ureteroscopy. We also discussed the possibility of not being able to gain access to  the kidney and the need for nephrostomy tube. Possibility of long-term stent was also discussed. The patient voiced their understanding and would like to proceed.   Ucx negative proceed with left URS today  GH eval in OR today as well.

## 2024-07-23 ENCOUNTER — Encounter (HOSPITAL_COMMUNITY)
Admission: RE | Disposition: A | Discharge status: Home / Self Care | Payer: Self-pay | Source: Home / Self Care | Attending: Urology

## 2024-07-23 ENCOUNTER — Ambulatory Visit (HOSPITAL_COMMUNITY)
Admission: RE | Admit: 2024-07-23 | Discharge: 2024-07-23 | Disposition: A | Discharge status: Home / Self Care | Attending: Urology | Admitting: Urology

## 2024-07-23 ENCOUNTER — Other Ambulatory Visit: Payer: Self-pay

## 2024-07-23 ENCOUNTER — Encounter (HOSPITAL_COMMUNITY): Payer: Self-pay | Admitting: Urology

## 2024-07-23 ENCOUNTER — Ambulatory Visit (HOSPITAL_BASED_OUTPATIENT_CLINIC_OR_DEPARTMENT_OTHER): Admitting: Anesthesiology

## 2024-07-23 ENCOUNTER — Ambulatory Visit (HOSPITAL_COMMUNITY)

## 2024-07-23 ENCOUNTER — Ambulatory Visit (HOSPITAL_COMMUNITY): Payer: Self-pay | Admitting: Physician Assistant

## 2024-07-23 DIAGNOSIS — N202 Calculus of kidney with calculus of ureter: Secondary | ICD-10-CM | POA: Diagnosis not present

## 2024-07-23 DIAGNOSIS — K219 Gastro-esophageal reflux disease without esophagitis: Secondary | ICD-10-CM | POA: Diagnosis not present

## 2024-07-23 DIAGNOSIS — N132 Hydronephrosis with renal and ureteral calculous obstruction: Secondary | ICD-10-CM | POA: Insufficient documentation

## 2024-07-23 DIAGNOSIS — F419 Anxiety disorder, unspecified: Secondary | ICD-10-CM | POA: Insufficient documentation

## 2024-07-23 DIAGNOSIS — M1990 Unspecified osteoarthritis, unspecified site: Secondary | ICD-10-CM | POA: Diagnosis not present

## 2024-07-23 DIAGNOSIS — Z87442 Personal history of urinary calculi: Secondary | ICD-10-CM | POA: Diagnosis not present

## 2024-07-23 DIAGNOSIS — R519 Headache, unspecified: Secondary | ICD-10-CM | POA: Insufficient documentation

## 2024-07-23 DIAGNOSIS — R310 Gross hematuria: Secondary | ICD-10-CM | POA: Insufficient documentation

## 2024-07-23 DIAGNOSIS — E119 Type 2 diabetes mellitus without complications: Secondary | ICD-10-CM | POA: Diagnosis not present

## 2024-07-23 DIAGNOSIS — N200 Calculus of kidney: Secondary | ICD-10-CM | POA: Diagnosis present

## 2024-07-23 HISTORY — PX: CYSTOSCOPY/URETEROSCOPY/HOLMIUM LASER/STENT PLACEMENT: SHX6546

## 2024-07-23 HISTORY — PX: CYSTOSCOPY/RETROGRADE/URETEROSCOPY: SHX5316

## 2024-07-23 LAB — GLUCOSE, CAPILLARY
Glucose-Capillary: 119 mg/dL — ABNORMAL HIGH (ref 70–99)
Glucose-Capillary: 103 mg/dL — ABNORMAL HIGH (ref 70–99)

## 2024-07-23 SURGERY — CYSTOSCOPY/URETEROSCOPY/HOLMIUM LASER/STENT PLACEMENT
Anesthesia: General | Laterality: Left

## 2024-07-23 MED ORDER — CIPROFLOXACIN IN D5W 400 MG/200ML IV SOLN
400.0000 mg | INTRAVENOUS | Status: AC
  Administered 2024-07-23: 400 mg via INTRAVENOUS
  Filled 2024-07-23: qty 200

## 2024-07-23 MED ORDER — ORAL CARE MOUTH RINSE: 15.0000 mL | Freq: Once | OROMUCOSAL | Status: AC

## 2024-07-23 MED ORDER — KETOROLAC TROMETHAMINE 30 MG/ML IJ SOLN
INTRAMUSCULAR | Status: DC | PRN
  Administered 2024-07-23: 15 mg via INTRAVENOUS

## 2024-07-23 MED ORDER — ONDANSETRON HCL 4 MG/2ML IJ SOLN
INTRAMUSCULAR | Status: DC | PRN
  Administered 2024-07-23: 4 mg via INTRAVENOUS

## 2024-07-23 MED ORDER — KETOROLAC TROMETHAMINE 30 MG/ML IJ SOLN
INTRAMUSCULAR | Status: AC
  Filled 2024-07-23: qty 1

## 2024-07-23 MED ORDER — OXYCODONE-ACETAMINOPHEN 5-325 MG PO TABS: 1.0000 | ORAL_TABLET | Freq: Four times a day (QID) | ORAL | 0 refills | Status: AC | PRN

## 2024-07-23 MED ORDER — CHLORHEXIDINE GLUCONATE 0.12 % MT SOLN
15.0000 mL | Freq: Once | OROMUCOSAL | Status: AC
  Administered 2024-07-23: 15 mL via OROMUCOSAL

## 2024-07-23 MED ORDER — FENTANYL CITRATE (PF) 100 MCG/2ML IJ SOLN
INTRAMUSCULAR | Status: AC
  Filled 2024-07-23: qty 2

## 2024-07-23 MED ORDER — SODIUM CHLORIDE 0.9 % IR SOLN
Status: DC | PRN
  Administered 2024-07-23: 6000 mL via INTRAVESICAL

## 2024-07-23 MED ORDER — SUGAMMADEX SODIUM 200 MG/2ML IV SOLN
INTRAVENOUS | Status: AC
  Filled 2024-07-23: qty 2

## 2024-07-23 MED ORDER — LIDOCAINE HCL (CARDIAC) PF 100 MG/5ML IV SOSY
PREFILLED_SYRINGE | INTRAVENOUS | Status: DC | PRN
  Administered 2024-07-23: 60 mg via INTRAVENOUS

## 2024-07-23 MED ORDER — FENTANYL CITRATE (PF) 100 MCG/2ML IJ SOLN
INTRAMUSCULAR | Status: DC | PRN
  Administered 2024-07-23 (×2): 50 ug via INTRAVENOUS

## 2024-07-23 MED ORDER — MIDAZOLAM HCL 2 MG/2ML IJ SOLN
INTRAMUSCULAR | Status: AC
  Filled 2024-07-23: qty 2

## 2024-07-23 MED ORDER — FENTANYL CITRATE PF 50 MCG/ML IJ SOSY: 25.0000 ug | PREFILLED_SYRINGE | INTRAMUSCULAR | Status: DC | PRN

## 2024-07-23 MED ORDER — LACTATED RINGERS IV SOLN: INTRAVENOUS | Status: DC

## 2024-07-23 MED ORDER — ACETAMINOPHEN 10 MG/ML IV SOLN
INTRAVENOUS | Status: AC
  Filled 2024-07-23: qty 100

## 2024-07-23 MED ORDER — LIDOCAINE HCL (PF) 2 % IJ SOLN
INTRAMUSCULAR | Status: AC
  Filled 2024-07-23: qty 5

## 2024-07-23 MED ORDER — ROCURONIUM BROMIDE 10 MG/ML (PF) SYRINGE
PREFILLED_SYRINGE | INTRAVENOUS | Status: AC
  Filled 2024-07-23: qty 10

## 2024-07-23 MED ORDER — PHENAZOPYRIDINE HCL 200 MG PO TABS: 200.0000 mg | ORAL_TABLET | Freq: Three times a day (TID) | ORAL | 0 refills | Status: DC | PRN

## 2024-07-23 MED ORDER — INSULIN ASPART 100 UNIT/ML IJ SOLN: 0.0000 [IU] | INTRAMUSCULAR | Status: DC | PRN

## 2024-07-23 MED ORDER — OXYCODONE HCL 5 MG/5ML PO SOLN: 5.0000 mg | Freq: Once | ORAL | Status: DC | PRN

## 2024-07-23 MED ORDER — IOHEXOL 300 MG/ML  SOLN
INTRAMUSCULAR | Status: DC | PRN
  Administered 2024-07-23: 17 mL

## 2024-07-23 MED ORDER — PROPOFOL 10 MG/ML IV BOLUS
INTRAVENOUS | Status: AC
  Filled 2024-07-23: qty 20

## 2024-07-23 MED ORDER — ONDANSETRON HCL 4 MG/2ML IJ SOLN: 4.0000 mg | Freq: Four times a day (QID) | INTRAMUSCULAR | Status: DC | PRN

## 2024-07-23 MED ORDER — OXYCODONE HCL 5 MG PO TABS: 5.0000 mg | ORAL_TABLET | Freq: Once | ORAL | Status: DC | PRN

## 2024-07-23 MED ORDER — ACETAMINOPHEN 10 MG/ML IV SOLN
INTRAVENOUS | Status: DC | PRN
  Administered 2024-07-23: 1000 mg via INTRAVENOUS

## 2024-07-23 MED ORDER — TAMSULOSIN HCL 0.4 MG PO CAPS: 0.4000 mg | ORAL_CAPSULE | Freq: Every day | ORAL | 0 refills | Status: AC

## 2024-07-23 MED ORDER — DEXAMETHASONE SODIUM PHOSPHATE 10 MG/ML IJ SOLN
INTRAMUSCULAR | Status: AC
Start: 2024-07-23 — End: 2024-07-23
  Filled 2024-07-23: qty 1

## 2024-07-23 MED ORDER — PROPOFOL 10 MG/ML IV BOLUS
INTRAVENOUS | Status: DC | PRN
  Administered 2024-07-23: 160 mg via INTRAVENOUS

## 2024-07-23 MED ORDER — MIDAZOLAM HCL 5 MG/5ML IJ SOLN
INTRAMUSCULAR | Status: DC | PRN
  Administered 2024-07-23 (×2): 1 mg via INTRAVENOUS

## 2024-07-23 MED ORDER — ONDANSETRON HCL 4 MG/2ML IJ SOLN
INTRAMUSCULAR | Status: AC
  Filled 2024-07-23: qty 2

## 2024-07-23 SURGICAL SUPPLY — 19 items
BAG URO CATCHER STRL LF (MISCELLANEOUS) ×1 IMPLANT
BASKET ZERO TIP NITINOL 2.4FR (BASKET) IMPLANT
CATH URETL OPEN 5X70 (CATHETERS) IMPLANT
CLOTH BEACON ORANGE TIMEOUT ST (SAFETY) ×1 IMPLANT
EXTRACTOR STONE 1.7FRX115CM (UROLOGICAL SUPPLIES) IMPLANT
FIBER LASER MOSES 200 DFL (Laser) IMPLANT
FIBER LASER MOSES 365 DFL (Laser) IMPLANT
GLOVE BIO SURGEON STRL SZ8 (GLOVE) ×1 IMPLANT
GOWN STRL REUS W/ TWL XL LVL3 (GOWN DISPOSABLE) ×1 IMPLANT
GUIDEWIRE ANG ZIPWIRE 038X150 (WIRE) IMPLANT
GUIDEWIRE STR DUAL SENSOR (WIRE) ×1 IMPLANT
KIT TURNOVER KIT A (KITS) ×1 IMPLANT
MANIFOLD NEPTUNE II (INSTRUMENTS) ×1 IMPLANT
PACK CYSTO (CUSTOM PROCEDURE TRAY) ×1 IMPLANT
SHEATH NAVIGATOR HD 11/13X28 (SHEATH) IMPLANT
SHEATH NAVIGATOR HD 11/13X36 (SHEATH) ×1 IMPLANT
STENT URET 6FRX24 CONTOUR (STENTS) IMPLANT
TUBING CONNECTING 10 (TUBING) ×1 IMPLANT
TUBING UROLOGY SET (TUBING) ×1 IMPLANT

## 2024-07-23 NOTE — Transfer of Care (Signed)
 Immediate Anesthesia Transfer of Care Note  Patient: Latoya Quinn  Procedure(s) Performed: CYSTOSCOPY/URETEROSCOPY/HOLMIUM LASER/STENT PLACEMENT (Left) CYSTOSCOPY/RETROGRADE/URETEROSCOPY (Left)  Patient Location: PACU  Anesthesia Type:General  Level of Consciousness: drowsy  Airway & Oxygen Therapy: Patient Spontanous Breathing and Patient connected to nasal cannula oxygen  Post-op Assessment: Report given to RN and Post -op Vital signs reviewed and stable  Post vital signs: Reviewed and stable  Last Vitals:  Vitals Value Taken Time  BP    Temp    Pulse    Resp    SpO2      Last Pain:  Vitals:   07/23/24 0712  TempSrc:   PainSc: 4       Patients Stated Pain Goal: 5 (07/23/24 0712)  Complications: No notable events documented.

## 2024-07-23 NOTE — Op Note (Signed)
 Preoperative diagnosis: left ureteral and renal calculus  Postoperative diagnosis: left ureteral and renal calculus  Procedure:  Cystoscopy left ureteroscopy and stone removal Ureteroscopic laser lithotripsy left 50F x 24 ureteral stent placement (no string) left retrograde pyelography with interpretation  Surgeon: Dr. Steffan Pea  Anesthesia: General  Complications: None  Intraoperative findings:  Significantly large stone burden in the left renal pelvis. 2 small ureteral stones in the distal ureter removed without complication\ Due to significant stone burden within the renal pelvis majority of stone was dusted will need second stage to confirm all fragments are small enough to be passable and treated appropriately. No injury or abnormalities to the ureter 6 x 24 double-J stent without strings left in the left ureter.  Left retrograde pyelogram interpretation: Distended pelvis, multiple filling defects within the lateral and lower poles of the kidney.  No contrast extravasation no evidence of perforation.  EBL: Minimal  Specimens: left ureteral calculus  Disposition of specimens: [facility] for stone analysis  Indication: Latoya Quinn is a 58 y.o.   patient with a  left ureteral stone and renal pelvis stone and associated left symptoms.  Due to significant stone burden it was explained the patient there would likely be a second stage ureteroscopy patient was agreeable to this plan.  After reviewing the management options for treatment, the patient elected to proceed with the above surgical procedure(s). We have discussed the potential benefits and risks of the procedure, side effects of the proposed treatment, the likelihood of the patient achieving the goals of the procedure, and any potential problems that might occur during the procedure or recuperation. Informed consent has been obtained.   Description of procedure:  The patient was taken to the  operating room and general anesthesia was induced.  The patient was placed in the dorsal lithotomy position, prepped and draped in the usual sterile fashion, and preoperative antibiotics were administered. A preoperative time-out was performed.   Cystourethroscopy was performed.  The patient's urethra was examined and was normal. Pan cystoscopy was performed and the bladder systematically examined in its entirety. There was no evidence for any bladder tumors, stones, or other mucosal pathology.    Attention then turned to the left ureteral orifice.   A 0.38 sensor guidewire was then advanced up the left ureter into the renal pelvis under fluoroscopic guidance. The 4.5 Fr semirigid ureteroscope was then advanced into the ureter next to the guidewire and the calculus was identified.   The stone was then fragmented with the 200 micron holmium laser fiber on a setting of 0.6 and frequency of 6 Hz.   All stones were then removed from the ureter with an N-gage nitinol basket.  Reinspection of the ureter revealed no remaining visible stones or fragments.   The semirigid scope was then driven into the renal pelvis.  I retrograde pyelogram was performed demonstrating no contrast examination multiple filling defects within the lower poles and lateral pole of kidney.  A second sensor wire was then introduced into the renal pelvis.  Then a 36 cm 11-13 French ureteral access sheath was placed over one of the sensor wires.  Leaving another sensor wire as a safety wire.  The dual-lumen flexible ureteroscope was then introduced in the renal pelvis.  Pyeloscopy was performed there is noted to be 1 stone protruding significant into the pelvis as well as multiple lateral calyx and lower pole calyx stones identified.  No upper pole stones identified.  Using the 200 m laser using  settings of 1 J and 15 Hz the stones were fragmented into the lateral lower poles.  Duodenum stone was dusted but due to significant stone  burden as well as complexity of stone locations not all stones will be dusted to less than 2 mm in size.  After significant amount of dusting was done and majority of stone was broken up decision was made to do a second stage.  The scope and sheath were removed visualizing the ureter on removal there was no injury or abnormalities to the ureter.  A second retrograde pyelogram was performed demonstrated no contrast extravasation no perforation of the ureter.  After the scope was removed the wire was then backloaded onto the cystoscope and a 6 x 24 double-J stent was inserted into the left ureter without strings by placement times was confirmed with fluoroscopy.  The bladder was then emptied and the procedure ended.  The patient appeared to tolerate the procedure well and without complications.  The patient was able to be awakened and transferred to the recovery unit in satisfactory condition.   Disposition: Patient will be called to set up second stage ureteroscopy.

## 2024-07-23 NOTE — Discharge Instructions (Addendum)
 DISCHARGE INSTRUCTIONS FOR Ureteroscopy and/or Ureteral Stent Placement  MEDICATIONS:  1.  Pyridium   2. Tamsulosin   3. Percocet   ACTIVITY:  1. No strenuous activity x 1week  2. No driving while on narcotic pain medications  3. Drink plenty of water  4. Continue to walk at home - it is normal to see blood in the urine while the stent is in place, so keep active, but don't over do it.  5. May return to work/school tomorrow or when you feel ready  6. You may experience some pain when urinating in the kidney on the side that was operated on while the stent is in place this is normal  WHAT IS NORMAL TO EXPERIENCE: It is normal to feel the urge to urinate while the stent is in place It is normal to have blood in your urine while the stent is in place  It sometimes can be normal to have pain in your kidney when you urinate   BATHING:  1. You can shower and we recommend daily showers    DIET: You may return to your normal diet immediately. Because of the raw surface of your bladder, alcohol, spicy foods, acid type foods and drinks with caffeine may cause irritation or frequency and should be used in moderation. To keep your urine flowing freely and to avoid constipation, drink plenty of fluids during the day ( 8-10 glasses ). Tip: Avoid cranberry juice because it is very acidic.  SIGNS/SYMPTOMS TO CALL:  Please call us  if you have a fever greater than 101.5, uncontrolled nausea/vomiting, uncontrolled pain, dizziness, unable to urinate, bloody urine with clots greater than the size of a quarter, chest pain, shortness of breath, leg swelling, leg pain, redness around wound, drainage from wound, or any other concerns or questions.   You can reach us  at [phone].   FOLLOW-UP:   1. You will be called to set up your second stage ureteroscopy  2. You you have been set up for f/u in 6-8 weeks

## 2024-07-23 NOTE — Anesthesia Postprocedure Evaluation (Signed)
 Anesthesia Post Note  Patient: Latoya Quinn  Procedure(s) Performed: CYSTOSCOPY/URETEROSCOPY/HOLMIUM LASER/STENT PLACEMENT (Left) CYSTOSCOPY/RETROGRADE/URETEROSCOPY (Left)     Patient location during evaluation: PACU Anesthesia Type: General Level of consciousness: awake and alert Pain management: pain level controlled Vital Signs Assessment: post-procedure vital signs reviewed and stable Respiratory status: spontaneous breathing, nonlabored ventilation, respiratory function stable and patient connected to nasal cannula oxygen Cardiovascular status: blood pressure returned to baseline and stable Postop Assessment: no apparent nausea or vomiting Anesthetic complications: no   No notable events documented.  Last Vitals:  Vitals:   07/23/24 1145 07/23/24 1202  BP: 134/86 127/80  Pulse: 62 61  Resp:  20  Temp:  36.6 C  SpO2: 98% 100%    Last Pain:  Vitals:   07/23/24 1202  TempSrc: Oral  PainSc: 0-No pain                 Toivo Bordon S

## 2024-07-23 NOTE — Anesthesia Procedure Notes (Signed)
 Procedure Name: LMA Insertion Date/Time: 07/23/2024 9:02 AM  Performed by: Landy Chip HERO, CRNAPre-anesthesia Checklist: Patient identified, Emergency Drugs available, Suction available and Patient being monitored Patient Re-evaluated:Patient Re-evaluated prior to induction Oxygen Delivery Method: Circle System Utilized Preoxygenation: Pre-oxygenation with 100% oxygen Induction Type: IV induction Ventilation: Mask ventilation without difficulty LMA: LMA inserted LMA Size: 4.0 Number of attempts: 1 Airway Equipment and Method: Bite block Placement Confirmation: positive ETCO2 Tube secured with: Tape Dental Injury: Teeth and Oropharynx as per pre-operative assessment

## 2024-07-24 ENCOUNTER — Encounter (HOSPITAL_COMMUNITY): Payer: Self-pay | Admitting: Urology

## 2024-07-24 ENCOUNTER — Other Ambulatory Visit: Payer: Self-pay | Admitting: Urology

## 2024-07-29 NOTE — Progress Notes (Signed)
 Attempted to obtain medical history via telephone, unable to reach at this time. HIPAA compliant voicemail message left requesting return call to pre surgical testing department.

## 2024-07-30 ENCOUNTER — Other Ambulatory Visit: Payer: Self-pay

## 2024-07-30 ENCOUNTER — Encounter (HOSPITAL_COMMUNITY): Payer: Self-pay | Admitting: Urology

## 2024-07-30 NOTE — Progress Notes (Signed)
 Attempted to obtain medical history via telephone, unable to reach at this time. HIPAA compliant voicemail message left requesting return call to pre surgical testing department.

## 2024-08-01 LAB — STONE ANALYSIS
Calcium Oxalate Dihydrate: 50 %
Calcium Oxalate Monohydrate: 50 %
Weight Calculi: 2 mg

## 2024-08-07 NOTE — Anesthesia Preprocedure Evaluation (Signed)
 Anesthesia Evaluation  Patient identified by MRN, date of birth, ID band Patient awake    Reviewed: Allergy & Precautions, NPO status , Patient's Chart, lab work & pertinent test results  History of Anesthesia Complications Negative for: history of anesthetic complications  Airway Mallampati: II  TM Distance: >3 FB Neck ROM: Full    Dental no notable dental hx.    Pulmonary neg pulmonary ROS   Pulmonary exam normal        Cardiovascular negative cardio ROS Normal cardiovascular exam     Neuro/Psych  Headaches  Anxiety        GI/Hepatic Neg liver ROS,GERD  Medicated and Controlled,,  Endo/Other  diabetes, Type 2, Oral Hypoglycemic Agents    Renal/GU Left renal stone  negative genitourinary   Musculoskeletal  (+) Arthritis ,    Abdominal   Peds  Hematology negative hematology ROS (+)   Anesthesia Other Findings Day of surgery medications reviewed with patient.  Reproductive/Obstetrics                              Anesthesia Physical Anesthesia Plan  ASA: 2  Anesthesia Plan: General   Post-op Pain Management: Tylenol  PO (pre-op)*   Induction: Intravenous  PONV Risk Score and Plan: 3 and Treatment may vary due to age or medical condition, Ondansetron , Dexamethasone  and Midazolam   Airway Management Planned: LMA  Additional Equipment: None  Intra-op Plan:   Post-operative Plan: Extubation in OR  Informed Consent: I have reviewed the patients History and Physical, chart, labs and discussed the procedure including the risks, benefits and alternatives for the proposed anesthesia with the patient or authorized representative who has indicated his/her understanding and acceptance.     Dental advisory given  Plan Discussed with: CRNA  Anesthesia Plan Comments:          Anesthesia Quick Evaluation

## 2024-08-07 NOTE — H&P (Signed)
 58 year old female who had a CT completed on 05/24/2024 which demonstrated left hydronephrosis and a distal left ureteral stone 7 mm. CT was done at [facility]. She previously was having concerns of blood in her urine. She had some suprapubic pain. She has a history of kidney stones where she had a previous stent placement and lithotripsy.   PMH: No Cards, or Pulm disease, has been cleared, no blood thinners. DM2, metforman and jardiance   Left nephrolithiasis:  06/19/2024: Patient does have a history of ESWL done 8 years ago. Last stone passed many years ago. CT did today shows left distal ureteral stone as well as multiple left renal pelvis stones, right lower pole renal pelvis stone.  07/23/24: L URS w/ LL 08/08/24: restage URS   Gross hematuria:  06/19/2024: GH a couple months ago. Pt was having pain. Pt has low back pain. Pt seen by PCP no UTI. No hx of tobacco use. 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 No FH of Kidney or bladder Ca, Father had PCa. No Hx of colon Ca.     ALLERGIES: Penicillins Sulfa Drugs    MEDICATIONS: No Reported Medications     GU PSH: No GU PSH      PSH Notes: Cesarean Section   NON-GU PSH: Cesarean Delivery Only - 2017         GU PMH: Chronic cystitis (w/o hematuria), Chronic cystitis - 2017 Renal calculus, Nephrolithiasis - 2017    NON-GU PMH: Encounter for general adult medical examination without abnormal findings, Encounter for preventive health examination - 2017 Personal history of other diseases of the musculoskeletal system and connective tissue, History of arthritis - 2017    FAMILY HISTORY: 1 Daughter - Other 1 son - Other Blood In Urine - Runs in Family Dementia - Runs in Family Kidney Stones - Runs in Family Prostate Cancer - Runs In Family   SOCIAL HISTORY: Marital Status: Divorced Preferred Language: English; Ethnicity: Not Hispanic Or Latino; Race: Black or African American Current Smoking Status: Patient has never smoked.  <DIV'  Tobacco Use  Assessment Completed:  Used Tobacco in last 30 days? Does not use smokeless tobacco. Has never drank.  Does not use drugs. Drinks 3 caffeinated drinks per day. Has not had a blood transfusion. </DIV'    Notes: Occupation, Alcohol use, Number of children, Never a smoker, Divorced, Caffeine use   REVIEW OF SYSTEMS:     GU Review Female:  Patient reports frequent urination and get up at night to urinate. Patient denies hard to postpone urination, burning /pain with urination, leakage of urine, stream starts and stops, trouble starting your stream, have to strain to urinate, and being pregnant.    Gastrointestinal (Upper):  Patient denies nausea, vomiting, and indigestion/ heartburn.    Gastrointestinal (Lower):  Patient reports constipation. Patient denies diarrhea.    Constitutional:  Patient reports fatigue. Patient denies fever, night sweats, and weight loss.    Skin:  Patient denies skin rash/ lesion and itching.    Eyes:  Patient denies blurred vision and double vision.    Ears/ Nose/ Throat:  Patient denies sore throat and sinus problems.    Hematologic/Lymphatic:  Patient denies swollen glands and easy bruising.    Cardiovascular:  Patient denies chest pains and leg swelling.    Respiratory:  Patient denies cough and shortness of breath.    Endocrine:  Patient denies excessive thirst.    Musculoskeletal:  Patient reports back pain and joint pain.     Neurological:  Patient  denies headaches and dizziness.    Psychologic:  Patient reports anxiety. Patient denies depression.    VITAL SIGNS:       06/19/2024 08:23 AM     BP 125/83 mmHg     Pulse 78 /min     Temperature 97.1 F / 36.1 C     MULTI-SYSTEM PHYSICAL EXAMINATION:      Constitutional: Well-nourished. No physical deformities. Normally developed. Good grooming.     Respiratory: No labored breathing, no use of accessory muscles.      Cardiovascular: Normal temperature, normal extremity pulses, no swelling, no varicosities.      Skin: No paleness, no jaundice, no cyanosis. No lesion, no ulcer, no rash.     Musculoskeletal: Normal gait and station of head and neck.            Complexity of Data:   Source Of History:  Patient  Records Review:  Previous Patient Records  Urine Test Review:  Urinalysis  X-Ray Review: C.T. Stone Protocol: Reviewed Films. Discussed With Patient. Left distal ureteral stone x 2 multiple layering stones throughout the left kidney would likely cause problems in the future. Mild left hydronephrosis right-sided lower pole stone no right-sided hydronephrosis. Stone is visualized on KUB/scout film.    PROCEDURES:    C.T. Urogram - [id number]      Patient confirmed No Neulasta OnPro Device.    Visit Complexity - G2211      Urinalysis w/Scope  Dipstick Dipstick Cont'd Micro  Color: Yellow Bilirubin: Neg mg/dL WBC/hpf: 0 - 5/hpf  Appearance: Cloudy Ketones: Neg mg/dL RBC/hpf: >39/yeq  Specific Gravity: 1.025 Blood: 3+ ery/uL Bacteria: Few (10-25/hpf)  pH: 6.0 Protein: Neg mg/dL Cystals: NS (Not Seen)  Glucose: 2+ mg/dL Urobilinogen: 0.2 mg/dL Casts: NS (Not Seen)   Nitrites: Neg Trichomonas: Not Present   Leukocyte Esterase: 1+ leu/uL Mucous: Not Present    Epithelial Cells: 0 - 5/hpf    Yeast: NS (Not Seen)    Sperm: Not Present    ASSESSMENT:     ICD-10 Details  1 GU:  Renal calculus - N20.0 Undiagnosed New Problem  2  Gross hematuria - R31.0 Undiagnosed New Problem   PLAN:   Orders  Labs BMP, Urine Culture  X-Rays: C.T. Stone Protocol Without I.V. Contrast  X-Ray Notes: . History:  Hematuria: Yes/No  Patient to see MD after exam: Yes/No  Previous exam: CT / IVP/ US / KUB/ None  When:  Where:  Diabetic: Yes/ No  BUN/ Creatinine:  Date of last BUN Creatinine:  Weight in pounds:  Allergy- IV Contrast: Yes/ No  Conflicting diabetic meds: Yes/ No  Diabetic Meds:  Prior Authorization #: NPCR per [place] Case# [id number]    Schedule    Document  Letter(s):   Created for Patient: Clinical Summary   Notes:  Hematuria:   Nephrolithiasis: Patient has left ureteral stone as well as left renal pelvis stone. Right lower pole renal pelvis stone. Chem-7 and Ucx ordered today. Discussed options of management including ESWL, ureteroscopy and PCNL discussed the benefits and risks of all. Patient preferred to do ureteroscopy due to no need for analysis today.   We discussed the risk benefits and alternatives to ureteroscopy. This includes bleeding, infection, damage to surrounding structures including the urethra, bladder, ureter, and kidney. With these possible injuries resulting in need for intervention in the future. We discussed inability to remove all the stone and requiring follow-up ureteroscopy. We also discussed the possibility of not being able to gain access to  the kidney and the need for nephrostomy tube. Possibility of long-term stent was also discussed. The patient voiced their understanding and would like to proceed.   2nd look URS today. UCS negative

## 2024-08-08 ENCOUNTER — Encounter (HOSPITAL_COMMUNITY)
Admission: RE | Disposition: A | Discharge status: Home / Self Care | Payer: Self-pay | Source: Home / Self Care | Attending: Urology

## 2024-08-08 ENCOUNTER — Ambulatory Visit (HOSPITAL_COMMUNITY): Payer: Self-pay | Admitting: Anesthesiology

## 2024-08-08 ENCOUNTER — Ambulatory Visit (HOSPITAL_COMMUNITY)

## 2024-08-08 ENCOUNTER — Other Ambulatory Visit: Payer: Self-pay

## 2024-08-08 ENCOUNTER — Ambulatory Visit (HOSPITAL_COMMUNITY)
Admission: RE | Admit: 2024-08-08 | Discharge: 2024-08-08 | Disposition: A | Discharge status: Home / Self Care | Attending: Urology | Admitting: Urology

## 2024-08-08 ENCOUNTER — Encounter (HOSPITAL_COMMUNITY): Payer: Self-pay | Admitting: Urology

## 2024-08-08 DIAGNOSIS — E119 Type 2 diabetes mellitus without complications: Secondary | ICD-10-CM | POA: Diagnosis not present

## 2024-08-08 DIAGNOSIS — K219 Gastro-esophageal reflux disease without esophagitis: Secondary | ICD-10-CM | POA: Insufficient documentation

## 2024-08-08 DIAGNOSIS — N201 Calculus of ureter: Secondary | ICD-10-CM | POA: Diagnosis not present

## 2024-08-08 DIAGNOSIS — N202 Calculus of kidney with calculus of ureter: Secondary | ICD-10-CM | POA: Insufficient documentation

## 2024-08-08 DIAGNOSIS — Z7984 Long term (current) use of oral hypoglycemic drugs: Secondary | ICD-10-CM | POA: Insufficient documentation

## 2024-08-08 DIAGNOSIS — N200 Calculus of kidney: Secondary | ICD-10-CM | POA: Diagnosis present

## 2024-08-08 HISTORY — PX: CYSTOSCOPY/URETEROSCOPY/HOLMIUM LASER/STENT PLACEMENT: SHX6546

## 2024-08-08 HISTORY — PX: CYSTOSCOPY W/ RETROGRADES: SHX1426

## 2024-08-08 LAB — GLUCOSE, CAPILLARY
Glucose-Capillary: 142 mg/dL — ABNORMAL HIGH (ref 70–99)
Glucose-Capillary: 128 mg/dL — ABNORMAL HIGH (ref 70–99)

## 2024-08-08 LAB — BASIC METABOLIC PANEL WITH GFR
Anion gap: 11 (ref 5–15)
BUN: 14 mg/dL (ref 6–20)
CO2: 27 mmol/L (ref 22–32)
Calcium: 9.5 mg/dL (ref 8.9–10.3)
Chloride: 106 mmol/L (ref 98–111)
Creatinine, Ser: 0.73 mg/dL (ref 0.44–1.00)
GFR, Estimated: >60 mL/min (ref >60)
Glucose, Bld: 147 mg/dL — ABNORMAL HIGH (ref 70–99)
Potassium: 5 mmol/L (ref 3.5–5.1)
Sodium: 144 mmol/L (ref 135–145)

## 2024-08-08 SURGERY — CYSTOSCOPY/URETEROSCOPY/HOLMIUM LASER/STENT PLACEMENT
Anesthesia: General | Site: Ureter | Laterality: Left

## 2024-08-08 MED ORDER — LACTATED RINGERS IV SOLN: INTRAVENOUS | Status: DC

## 2024-08-08 MED ORDER — IOHEXOL 300 MG/ML  SOLN
INTRAMUSCULAR | Status: DC | PRN
  Administered 2024-08-08: 10 mL

## 2024-08-08 MED ORDER — OXYCODONE HCL 5 MG/5ML PO SOLN: 5.0000 mg | Freq: Once | ORAL | Status: DC | PRN

## 2024-08-08 MED ORDER — INSULIN ASPART 100 UNIT/ML IJ SOLN: 0.0000 [IU] | INTRAMUSCULAR | Status: DC | PRN

## 2024-08-08 MED ORDER — LIDOCAINE HCL (PF) 2 % IJ SOLN
INTRAMUSCULAR | Status: AC
  Filled 2024-08-08: qty 5

## 2024-08-08 MED ORDER — CHLORHEXIDINE GLUCONATE 0.12 % MT SOLN
15.0000 mL | Freq: Once | OROMUCOSAL | Status: AC
  Administered 2024-08-08: 15 mL via OROMUCOSAL

## 2024-08-08 MED ORDER — METHOCARBAMOL 750 MG PO TABS
750.0000 mg | ORAL_TABLET | Freq: Four times a day (QID) | ORAL | 0 refills | Status: AC
Start: 2024-08-08 — End: 2024-08-13

## 2024-08-08 MED ORDER — DROPERIDOL 2.5 MG/ML IJ SOLN: 0.6250 mg | Freq: Once | INTRAMUSCULAR | Status: DC | PRN

## 2024-08-08 MED ORDER — ONDANSETRON HCL 4 MG/2ML IJ SOLN
INTRAMUSCULAR | Status: AC
  Filled 2024-08-08: qty 2

## 2024-08-08 MED ORDER — HYOSCYAMINE SULFATE 0.125 MG PO TBDP: 0.1250 mg | ORAL_TABLET | Freq: Four times a day (QID) | ORAL | 0 refills | Status: AC | PRN

## 2024-08-08 MED ORDER — GENTAMICIN SULFATE 40 MG/ML IJ SOLN
5.0000 mg/kg | INTRAVENOUS | Status: AC
  Administered 2024-08-08: 400 mg via INTRAVENOUS
  Filled 2024-08-08: qty 10

## 2024-08-08 MED ORDER — OXYCODONE HCL 5 MG PO TABS: 5.0000 mg | ORAL_TABLET | Freq: Once | ORAL | Status: DC | PRN

## 2024-08-08 MED ORDER — SODIUM CHLORIDE 0.9 % IR SOLN
Status: DC | PRN
  Administered 2024-08-08: 3000 mL

## 2024-08-08 MED ORDER — PHENYLEPHRINE 80 MCG/ML (10ML) SYRINGE FOR IV PUSH (FOR BLOOD PRESSURE SUPPORT)
PREFILLED_SYRINGE | INTRAVENOUS | Status: AC
  Filled 2024-08-08: qty 10

## 2024-08-08 MED ORDER — TAMSULOSIN HCL 0.4 MG PO CAPS: 0.4000 mg | ORAL_CAPSULE | Freq: Every day | ORAL | 0 refills | Status: AC

## 2024-08-08 MED ORDER — ONDANSETRON HCL 4 MG/2ML IJ SOLN
INTRAMUSCULAR | Status: DC | PRN
  Administered 2024-08-08: 4 mg via INTRAVENOUS

## 2024-08-08 MED ORDER — PHENYLEPHRINE 80 MCG/ML (10ML) SYRINGE FOR IV PUSH (FOR BLOOD PRESSURE SUPPORT)
PREFILLED_SYRINGE | INTRAVENOUS | Status: DC | PRN
  Administered 2024-08-08: 160 ug via INTRAVENOUS
  Administered 2024-08-08: 24 ug via INTRAVENOUS
  Administered 2024-08-08: 240 ug via INTRAVENOUS
  Administered 2024-08-08: 160 ug via INTRAVENOUS
  Administered 2024-08-08: 240 ug via INTRAVENOUS
  Administered 2024-08-08: 160 ug via INTRAVENOUS

## 2024-08-08 MED ORDER — EPHEDRINE 5 MG/ML INJ
INTRAVENOUS | Status: AC
  Filled 2024-08-08: qty 5

## 2024-08-08 MED ORDER — PROPOFOL 10 MG/ML IV BOLUS
INTRAVENOUS | Status: DC | PRN
  Administered 2024-08-08: 200 mg via INTRAVENOUS

## 2024-08-08 MED ORDER — EPHEDRINE SULFATE-NACL 50-0.9 MG/10ML-% IV SOSY
PREFILLED_SYRINGE | INTRAVENOUS | Status: DC | PRN
  Administered 2024-08-08: 10 mg via INTRAVENOUS
  Administered 2024-08-08: 15 mg via INTRAVENOUS

## 2024-08-08 MED ORDER — FENTANYL CITRATE (PF) 100 MCG/2ML IJ SOLN
INTRAMUSCULAR | Status: DC | PRN
  Administered 2024-08-08 (×2): 25 ug via INTRAVENOUS
  Administered 2024-08-08: 50 ug via INTRAVENOUS

## 2024-08-08 MED ORDER — DEXAMETHASONE SODIUM PHOSPHATE 10 MG/ML IJ SOLN
INTRAMUSCULAR | Status: DC | PRN
  Administered 2024-08-08: 4 mg via INTRAVENOUS

## 2024-08-08 MED ORDER — FENTANYL CITRATE (PF) 100 MCG/2ML IJ SOLN
INTRAMUSCULAR | Status: AC
  Filled 2024-08-08: qty 2

## 2024-08-08 MED ORDER — DEXAMETHASONE SODIUM PHOSPHATE 10 MG/ML IJ SOLN
INTRAMUSCULAR | Status: AC
  Filled 2024-08-08: qty 1

## 2024-08-08 MED ORDER — 0.9 % SODIUM CHLORIDE (POUR BTL) OPTIME
TOPICAL | Status: DC | PRN
Start: 2024-08-08 — End: 2024-08-08
  Administered 2024-08-08: 1000 mL

## 2024-08-08 MED ORDER — MIDAZOLAM HCL 5 MG/5ML IJ SOLN
INTRAMUSCULAR | Status: DC | PRN
  Administered 2024-08-08: 2 mg via INTRAVENOUS

## 2024-08-08 MED ORDER — LIDOCAINE HCL (PF) 2 % IJ SOLN
INTRAMUSCULAR | Status: DC | PRN
Start: 2024-08-08 — End: 2024-08-08
  Administered 2024-08-08: 100 mg via INTRADERMAL

## 2024-08-08 MED ORDER — MIDAZOLAM HCL 2 MG/2ML IJ SOLN
INTRAMUSCULAR | Status: AC
  Filled 2024-08-08: qty 2

## 2024-08-08 MED ORDER — FENTANYL CITRATE PF 50 MCG/ML IJ SOSY: 25.0000 ug | PREFILLED_SYRINGE | INTRAMUSCULAR | Status: DC | PRN

## 2024-08-08 MED ORDER — ACETAMINOPHEN 500 MG PO TABS
1000.0000 mg | ORAL_TABLET | Freq: Once | ORAL | Status: AC
  Administered 2024-08-08: 1000 mg via ORAL
  Filled 2024-08-08: qty 2

## 2024-08-08 MED ORDER — ORAL CARE MOUTH RINSE: 15.0000 mL | Freq: Once | OROMUCOSAL | Status: AC

## 2024-08-08 MED ORDER — PROPOFOL 10 MG/ML IV BOLUS
INTRAVENOUS | Status: AC
  Filled 2024-08-08: qty 20

## 2024-08-08 SURGICAL SUPPLY — 24 items
BAG COUNTER SPONGE SURGICOUNT (BAG) IMPLANT
BAG URO CATCHER STRL LF (MISCELLANEOUS) ×1 IMPLANT
BASKET STONE NCOMPASS (UROLOGICAL SUPPLIES) IMPLANT
BASKET ZERO TIP NITINOL 2.4FR (BASKET) IMPLANT
CATH URETERAL DUAL LUMEN 10F (MISCELLANEOUS) IMPLANT
CATH URETL OPEN 5X70 (CATHETERS) IMPLANT
CLOTH BEACON ORANGE TIMEOUT ST (SAFETY) ×1 IMPLANT
EXTRACTOR STONE 1.7FRX115CM (UROLOGICAL SUPPLIES) IMPLANT
FIBER LASER MOSES 200 DFL (Laser) IMPLANT
FIBER LASER MOSES 365 DFL (Laser) IMPLANT
GLOVE BIO SURGEON STRL SZ8 (GLOVE) ×1 IMPLANT
GOWN STRL REUS W/ TWL XL LVL3 (GOWN DISPOSABLE) ×1 IMPLANT
GUIDEWIRE ANG ZIPWIRE 038X150 (WIRE) IMPLANT
GUIDEWIRE STR DUAL SENSOR (WIRE) ×1 IMPLANT
KIT TURNOVER KIT A (KITS) ×1 IMPLANT
MANIFOLD NEPTUNE II (INSTRUMENTS) ×1 IMPLANT
NS IRRIG 1000ML POUR BTL (IV SOLUTION) IMPLANT
PACK CYSTO (CUSTOM PROCEDURE TRAY) ×1 IMPLANT
SHEATH NAVIGATOR HD 11/13X28 (SHEATH) IMPLANT
SHEATH NAVIGATOR HD 11/13X36 (SHEATH) ×1 IMPLANT
STENT URET 6FRX24 CONTOUR (STENTS) IMPLANT
TRACTIP FLEXIVA PULS ID 200XHI (Laser) IMPLANT
TUBING CONNECTING 10 (TUBING) ×1 IMPLANT
TUBING UROLOGY SET (TUBING) ×1 IMPLANT

## 2024-08-08 NOTE — Discharge Instructions (Signed)
 DISCHARGE INSTRUCTIONS FOR Ureteroscopy and/or Ureteral Stent Placement  MEDICATIONS:  1.  Robaxin  2. Tamsulosin   3. Hyoscyamine    ACTIVITY:  1. No strenuous activity x 1week  2. No driving while on narcotic pain medications  3. Drink plenty of water  4. Continue to walk at home - it is normal to see blood in the urine while the stent is in place, so keep active, but don't over do it.  5. May return to work/school tomorrow or when you feel ready  6. You may experience some pain when urinating in the kidney on the side that was operated on while the stent is in place this is normal  WHAT IS NORMAL TO EXPERIENCE: It is normal to feel the urge to urinate while the stent is in place It is normal to have blood in your urine while the stent is in place  It sometimes can be normal to have pain in your kidney when you urinate   BATHING:  1. You can shower and we recommend daily showers  2. You have a string coming from your urethra: The stent string is attached to your ureteral stent. Do not pull on this until instructed.   DIET: You may return to your normal diet immediately. Because of the raw surface of your bladder, alcohol, spicy foods, acid type foods and drinks with caffeine may cause irritation or frequency and should be used in moderation. To keep your urine flowing freely and to avoid constipation, drink plenty of fluids during the day ( 8-10 glasses ). Tip: Avoid cranberry juice because it is very acidic.  SIGNS/SYMPTOMS TO CALL:  Please call us  if you have a fever greater than 101.5, uncontrolled nausea/vomiting, uncontrolled pain, dizziness, unable to urinate, bloody urine with clots greater than the size of a quarter, chest pain, shortness of breath, leg swelling, leg pain, redness around wound, drainage from wound, or any other concerns or questions.   You can reach us  at [phone].   FOLLOW-UP:  1. You may remove your stent in on Monday 08/12/24. To do this go into the  shower, grab hold of the tether coming from your urethra. Pull the tether consistent motion until the stent is removed from your body. The stent will be around 10 inches long with a curl on either end.  2. You you have been set up for f/u in 6-8 weeks

## 2024-08-08 NOTE — Anesthesia Postprocedure Evaluation (Signed)
 Anesthesia Post Note  Patient: Latoya Quinn  Procedure(s) Performed: CYSTOSCOPY/URETEROSCOPY/HOLMIUM LASER/STENT PLACEMENT (Left: Ureter) CYSTOSCOPY, WITH RETROGRADE PYELOGRAM (Left: Ureter)     Patient location during evaluation: PACU Anesthesia Type: General Level of consciousness: awake and alert Pain management: pain level controlled Vital Signs Assessment: post-procedure vital signs reviewed and stable Respiratory status: spontaneous breathing, nonlabored ventilation and respiratory function stable Cardiovascular status: blood pressure returned to baseline Postop Assessment: no apparent nausea or vomiting Anesthetic complications: no   No notable events documented.  Last Vitals:  Vitals:   08/08/24 1037 08/08/24 1045  BP: 106/70 102/68  Pulse: 69 66  Resp: 16 (!) 22  Temp:    SpO2: 99% 100%    Last Pain:  Vitals:   08/08/24 1045  TempSrc:   PainSc: 0-No pain                 Vertell Row

## 2024-08-08 NOTE — Op Note (Signed)
 Preoperative diagnosis: left ureteral calculus  Postoperative diagnosis: left ureteral calculus  Procedure:  Cystoscopy left ureteroscopy and stone removal Ureteroscopic laser lithotripsy left 65F x 24 ureteral stent placement w/ strings   left retrograde pyelography with interpretation  Surgeon: Steffan Pea MD.  Anesthesia: General  Complications: None  Intraoperative findings:  Several left ureteral stones removed complication. Single large lower pole stone noted in a oblique calyx that was dusted.  Due to difficult location of stones margins were than 2 mm removed without complication. 6 x 24 double-J stent with strings placed in left ureter confirmed with fluoroscopy  Left retrograde pyelogram interpretation: No filling defects no contrast extravasation demonstrated that all calyces been evaluated.  EBL: Minimal  Specimens: left ureteral calculus  Disposition of specimens: [facility] for stone analysis  Indication: Latoya Quinn is a 58 y.o.   patient with a left renal pelvis stone she previous had left ureteroscopy but there were still large fragments that had concerned about passing she returns today for second stage ureteroscopy.  After reviewing the management options for treatment, the patient elected to proceed with the above surgical procedure(s). We have discussed the potential benefits and risks of the procedure, side effects of the proposed treatment, the likelihood of the patient achieving the goals of the procedure, and any potential problems that might occur during the procedure or recuperation. Informed consent has been obtained.   Description of procedure:  The patient was taken to the operating room and general anesthesia was induced.  The patient was placed in the dorsal lithotomy position, prepped and draped in the usual sterile fashion, and preoperative antibiotics were administered. A preoperative time-out was performed.    Cystourethroscopy was performed.  The patient's urethra was examined and was normal. There was no evidence for any bladder tumors, stones, or other mucosal pathology.    Attention then turned to the left ureteral orifice.  A sensor wire was placed adjacent to the left ureteral stent that was already in place.  Placement was confirmed with fluoroscopy.  The left stent was then grasped and removed.  A 6 French dual-lumen semirigid ureteroscope was then advanced into the left ureter there were some small stones that were encountered.  These were then basketed out using the engage basket.  Once to the renal pelvis and all stones been removed a second sensor wire is then placed in the renal pelvis.   Placement was confirmed by fluoro. The dual lumen catheter was then removed and a ureteral access sheath size 35cm  was inserted over the sensor wire under fluoro to confirm proper placement at the ureteropelvic junction. The obturator and sensor wire were then removed, leaving the other wire on the outside of the sheath as a safety wire.   A flexible ureteroscopy was then advanced through the sheath and into the collecting system. Pan pyeloscopy was then performed.    The stone was then fragmented with a 200 micron holmium laser fiber on a setting of 0.5 and frequency of 40 Hz.    All stones were then removed from the ureter with an N-gage nitinol basket.  Reinspection of the ureter revealed no remaining visible stones or fragments.    All stones were fragmented to sizes less than 2mm in diameter.   Pan pyeloscopy was then performed and no stones greater than 2mm were visualized.   The sheath was then removed leaving the safety wire in place. Visualization of the ureter on withdrawal of the sheath showed no  injury to the ureter.   Using the sensor wire that was still in place.  A 6x24fr with strings was placed without complication over the wire using fluoroscopy. Proper placement was confirmed with  flouro.   The bladder was then emptied and the procedure ended.  The patient appeared to tolerate the procedure well and without complications.  The patient was able to be awakened and transferred to the recovery unit in satisfactory condition.   Disposition: The tether of the stent was left on and secured mons pubis.  The patient has been scheduled for followup in 6 weeks with a renal ultrasound.

## 2024-08-08 NOTE — Anesthesia Procedure Notes (Signed)
 Procedure Name: LMA Insertion Date/Time: 08/08/2024 9:04 AM  Performed by: Carleton Garnette SAUNDERS, CRNAPre-anesthesia Checklist: Patient identified, Emergency Drugs available, Suction available, Patient being monitored and Timeout performed Patient Re-evaluated:Patient Re-evaluated prior to induction Oxygen Delivery Method: Circle system utilized Preoxygenation: Pre-oxygenation with 100% oxygen Induction Type: IV induction LMA: LMA inserted LMA Size: 4.0 Tube type: Oral Number of attempts: 1 Placement Confirmation: positive ETCO2 and breath sounds checked- equal and bilateral Tube secured with: Tape Dental Injury: Teeth and Oropharynx as per pre-operative assessment

## 2024-08-08 NOTE — Transfer of Care (Signed)
 Immediate Anesthesia Transfer of Care Note  Patient: Latoya Quinn  Procedure(s) Performed: CYSTOSCOPY/URETEROSCOPY/HOLMIUM LASER/STENT PLACEMENT (Left: Ureter) CYSTOSCOPY, WITH RETROGRADE PYELOGRAM (Left: Ureter)  Patient Location: PACU  Anesthesia Type:General  Level of Consciousness: sedated  Airway & Oxygen Therapy: Patient Spontanous Breathing and Patient connected to face mask oxygen  Post-op Assessment: Report given to RN and Post -op Vital signs reviewed and stable  Post vital signs: Reviewed and stable  Last Vitals:  Vitals Value Taken Time  BP    Temp    Pulse 65 08/08/24 10:16  Resp 11 08/08/24 10:16  SpO2 100 % 08/08/24 10:16  Vitals shown include unfiled device data.  Last Pain:  Vitals:   08/08/24 0759  TempSrc:   PainSc: 0-No pain      Patients Stated Pain Goal: 5 (08/08/24 0755)  Complications: No notable events documented.

## 2024-08-09 ENCOUNTER — Encounter (HOSPITAL_COMMUNITY): Payer: Self-pay | Admitting: Urology

## 2024-08-19 LAB — STONE ANALYSIS
Calcium Oxalate Dihydrate: 20 %
Calcium Oxalate Monohydrate: 70 %
Calcium Phosphate (Hydroxyl): 10 %
Weight Calculi: 15 mg
